# Patient Record
Sex: Male | Born: 1990 | Race: White | Hispanic: No | Marital: Single | Smoking: Former smoker
Health system: Southern US, Community
[De-identification: ages and names within clinical notes are randomized; demographics above are authoritative.]

## PROBLEM LIST (undated history)

## (undated) HISTORY — PX: SHOULDER SURGERY: SHX246

## (undated) HISTORY — PX: WISDOM TOOTH EXTRACTION: SHX21

---
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
## 2016-09-09 DIAGNOSIS — Z0000 Encounter for general adult medical examination without abnormal findings: Secondary | ICD-10-CM | POA: Diagnosis not present

## 2017-03-08 ENCOUNTER — Emergency Department (HOSPITAL_COMMUNITY): Payer: 59

## 2017-03-08 ENCOUNTER — Emergency Department (HOSPITAL_COMMUNITY)
Admission: EM | Admit: 2017-03-08 | Discharge: 2017-03-08 | Disposition: A | Discharge status: Home / Self Care | Payer: 59 | Attending: Emergency Medicine | Admitting: Emergency Medicine

## 2017-03-08 ENCOUNTER — Encounter (HOSPITAL_COMMUNITY): Payer: Self-pay | Admitting: Emergency Medicine

## 2017-03-08 DIAGNOSIS — F17200 Nicotine dependence, unspecified, uncomplicated: Secondary | ICD-10-CM | POA: Diagnosis not present

## 2017-03-08 DIAGNOSIS — R079 Chest pain, unspecified: Secondary | ICD-10-CM | POA: Diagnosis not present

## 2017-03-08 DIAGNOSIS — R002 Palpitations: Secondary | ICD-10-CM | POA: Diagnosis not present

## 2017-03-08 LAB — I-STAT TROPONIN, ED: Troponin i, poc: 0 ng/mL (ref 0.00–0.08)

## 2017-03-08 LAB — BASIC METABOLIC PANEL
ANION GAP: 9 (ref 5–15)
BUN: 10 mg/dL (ref 6–20)
CHLORIDE: 105 mmol/L (ref 101–111)
CO2: 26 mmol/L (ref 22–32)
Calcium: 9.7 mg/dL (ref 8.9–10.3)
Creatinine, Ser: 1.03 mg/dL (ref 0.61–1.24)
GFR calc non Af Amer: >60 mL/min (ref >60)
Glucose, Bld: 105 mg/dL — ABNORMAL HIGH (ref 65–99)
POTASSIUM: 3.8 mmol/L (ref 3.5–5.1)
Sodium: 140 mmol/L (ref 135–145)

## 2017-03-08 LAB — CBC
HEMATOCRIT: 44.9 % (ref 39.0–52.0)
HEMOGLOBIN: 14.9 g/dL (ref 13.0–17.0)
MCH: 28.1 pg (ref 26.0–34.0)
MCHC: 33.2 g/dL (ref 30.0–36.0)
MCV: 84.6 fL (ref 78.0–100.0)
Platelets: 250 10*3/uL (ref 150–400)
RBC: 5.31 MIL/uL (ref 4.22–5.81)
RDW: 13.4 % (ref 11.5–15.5)
WBC: 6.7 10*3/uL (ref 4.0–10.5)

## 2017-03-08 NOTE — Discharge Instructions (Signed)
Follow-up with your primary care provider for reevaluation later this week. They may recommend a Holter monitor to evaluate you for abnormal heart rhythms such as PACs, PVCs, or SVT. If you feel that your palpitations have returned, and your heart rate is elevated (around 120-140 beats per minute), you may blow out continuously through a straw for as long as you can (ideally around 15-20 seconds). Alternatively, you can stick your thumb in your mouth and blow out for the same amount of time. Return to the ED if any concerning signs or symptoms develop such as chest pain, persistent shortness of breath, fevers, chills, or passing out.

## 2017-03-08 NOTE — ED Provider Notes (Signed)
[facility] Provider Note   CSN: [id number] Arrival date & time: 03/08/17  1405     History   Chief Complaint Chief Complaint  Patient presents with  . Palpitations    HPI Tyler Adams is a 26 y.o. male with no significant past medical history who presents today with chief complaint acute onset, resolved episode of palpitations earlier today. Patient states that he was sitting at rest at work around 1 PM when he experienced approximately 5-6 palpitations, followed by development of pallor, lightheadedness, diaphoresis, and chest tightness with associated shortness of breath. This episode did not last longer than a few minutes. He denies chest pain, syncope, nausea, vomiting. He took his blood pressure at the time and noted his systolic to be in the 170s. Symptoms have entirely resolved since then. No aggravating or alleviating factors, and patient did not try anything when this episode occurred. States that he occasionally experiences 1 palpation, but this was the first time it appeared to be sustained for any time. He has panic attacks on occasion, and says that this felt similarly except for the palpitations. States he occasionally smokes cigarettes, but not daily. Has a significant family history of cardiac events, but has no cardiac history himself. States he has reduced his caffeine intake, but did have a diet Coke earlier today. Denies recreational drug use or excessive alcohol intake. No recent travel or surgeries, no hemoptysis, no hormone replacement therapy, no history of cancer, no prior history of DVT or PE.  The history is provided by the patient.    History reviewed. No pertinent past medical history.  There are no active problems to display for this patient.   Past Surgical History:  Procedure Laterality Date  . SHOULDER SURGERY         Home Medications    Prior to Admission medications   Medication Sig Start Date End Date Taking? Authorizing Provider    ibuprofen (ADVIL,MOTRIN) 200 MG tablet Take 400 mg by mouth every 6 (six) hours as needed for moderate pain.   Yes [provider]    Family History No family history on file.  Social History Social History  Substance Use Topics  . Smoking status: Current Some Day Smoker  . Smokeless tobacco: Never Used  . Alcohol use Yes     Allergies   Patient has no known allergies.   Review of Systems Review of Systems  Constitutional: Positive for diaphoresis. Negative for chills and fever.  Respiratory: Positive for chest tightness and shortness of breath.   Cardiovascular: Positive for palpitations. Negative for chest pain and leg swelling.  Gastrointestinal: Negative for abdominal pain, diarrhea, nausea and vomiting.  Neurological: Positive for light-headedness. Negative for syncope, weakness and numbness.  All other systems reviewed and are negative.    Physical Exam Updated Vital Signs BP 138/76   Pulse 87   Temp 98.5 F (36.9 C) (Oral)   Resp 17   Wt 112.5 kg (248 lb)   SpO2 100%   Physical Exam  Constitutional: He appears well-developed and well-nourished. No distress.  HENT:  Head: Normocephalic and atraumatic.  Eyes: Conjunctivae are normal. Right eye exhibits no discharge. Left eye exhibits no discharge.  Neck: No JVD present. No tracheal deviation present.  Cardiovascular: Normal rate, regular rhythm, normal heart sounds and intact distal pulses.  Exam reveals no gallop and no friction rub.   No murmur heard. 2+ radial and DP/PT pulses bl, negative Homan's bl, no erythema or palpable cords, physiological splitting of  S1 and S2 on inspiration.   Pulmonary/Chest: Effort normal and breath sounds normal. No respiratory distress. He has no wheezes. He has no rales. He exhibits no tenderness.  Equal rise and fall of chest, no increased work of breathing  Abdominal: He exhibits no distension.  Musculoskeletal: He exhibits no edema.  Neurological: He is alert.   Fluent speech, no facial droop  Skin: Skin is warm and dry. Capillary refill takes less than 2 seconds. No erythema.  Psychiatric: He has a normal mood and affect. His behavior is normal.  Nursing note and vitals reviewed.    ED Treatments / Results  Labs (all labs ordered are listed, but only abnormal results are displayed) Labs Reviewed  BASIC METABOLIC PANEL - Abnormal; Notable for the following:       Result Value   Glucose, Bld 105 (*)    All other components within normal limits  CBC  I-STAT TROPOININ, ED    EKG  EKG Interpretation  Date/Time:  Monday March 08 2017 15:50:30 EDT Ventricular Rate:  101 PR Interval:  138 QRS Duration: 86 QT Interval:  338 QTC Calculation: 438 R Axis:   92 Text Interpretation:  Sinus tachycardia Rightward axis Borderline ECG rate faster but otherwise no significant change Confirmed by Frederick PeersLittle, Rachel [phone]([id number]) on 03/08/2017 7:40:14 PM       Radiology Dg Chest 2 View  Result Date: 03/08/2017 CLINICAL DATA:  Chest tightness and palpitations since 1:30 p.m. today. Measures noted to be hypertensive. The patient is an occasional smoker. EXAM: CHEST  2 VIEW COMPARISON:  None in PACs FINDINGS: The lungs are adequately inflated and clear. The heart and pulmonary vascularity are normal. The mediastinum is normal in width. The trachea is midline. The bony thorax is unremarkable. IMPRESSION: There is no active cardiopulmonary disease. Electronically Signed   By: David  SwazilandJordan M.D.   On: 03/08/2017 16:48    Procedures Procedures (including critical care time)  Medications Ordered in ED Medications - No data to display   Initial Impression / Assessment and Plan / ED Course  I have reviewed the triage vital signs and the nursing notes.  Pertinent labs & imaging results that were available during my care of the patient were reviewed by me and considered in my medical decision making (see chart for details).     Patient with complaint of episode  of heart palpitations earlier today with associated chest tightness, shortness of breath, pallor, and lightheadedness. Afebrile here, tachycardic and hypertensive initially, with resolution while in the ED. He is well-appearing and in no apparent distress. EKG shows sinus tachycardia but otherwise no significant change from last tracing. Troponin negative, CBC and BMP unremarkable. Chest x-ray shows no acute cardiopulmonary abnormalities. Low suspicion of ACS/MI or PE in the absence of risk factors. Low suspicion of infectious or inflammatory process such as pneumonia, bronchitis, or pericarditis. Suspect possible run of multiples PVCs/PACs or SVT which has resolved. He is safe for discharge home with follow-up with his primary care for further evaluation and possible Holter monitor. Discussed vagal maneuvers in the event he has any recurrence of symptoms with significant tachycardia. Discussed indications for return to the ED immediately. Pt verbalized understanding of and agreement with plan and is safe for discharge home at this time.  Final Clinical Impressions(s) / ED Diagnoses   Final diagnoses:  Palpitations    New Prescriptions New Prescriptions   No medications on file     Jeanie SewerFawze, Kenyette Gundy A, Cordelia Poche-C 03/08/17 2006  Little, Ambrose Finland, MD 03/11/17 [phone]

## 2017-03-08 NOTE — ED Notes (Signed)
Pt departed in NAD, refused use of wheelchair.  

## 2017-03-08 NOTE — ED Triage Notes (Signed)
Tightness and felt sob and got flushed and felt like his heart rate was up,  Did eat and he took his bp and it was up no n/v but feels  weird

## 2017-03-09 DIAGNOSIS — R002 Palpitations: Secondary | ICD-10-CM | POA: Diagnosis not present

## 2017-03-09 DIAGNOSIS — Z6838 Body mass index (BMI) 38.0-38.9, adult: Secondary | ICD-10-CM | POA: Diagnosis not present

## 2017-03-09 DIAGNOSIS — E6609 Other obesity due to excess calories: Secondary | ICD-10-CM | POA: Diagnosis not present

## 2017-03-09 DIAGNOSIS — R030 Elevated blood-pressure reading, without diagnosis of hypertension: Secondary | ICD-10-CM | POA: Diagnosis not present

## 2017-03-09 DIAGNOSIS — Z720 Tobacco use: Secondary | ICD-10-CM | POA: Diagnosis not present

## 2017-03-29 DIAGNOSIS — R002 Palpitations: Secondary | ICD-10-CM | POA: Diagnosis not present

## 2017-04-07 DIAGNOSIS — R002 Palpitations: Secondary | ICD-10-CM | POA: Diagnosis not present

## 2017-04-08 DIAGNOSIS — R002 Palpitations: Secondary | ICD-10-CM | POA: Diagnosis not present

## 2017-06-29 DIAGNOSIS — H52223 Regular astigmatism, bilateral: Secondary | ICD-10-CM | POA: Diagnosis not present

## 2017-08-09 MED FILL — METHYLPREDNISOLONE 4 MG TAB: 4 | 6 days supply | Qty: 21 | Fill #0

## 2017-09-24 ENCOUNTER — Ambulatory Visit
Admission: EM | Admit: 2017-09-24 | Discharge: 2017-09-24 | Disposition: A | Discharge status: Home / Self Care | Payer: 59 | Attending: Family Medicine | Admitting: Family Medicine

## 2017-09-24 ENCOUNTER — Other Ambulatory Visit: Payer: Self-pay

## 2017-09-24 ENCOUNTER — Encounter: Payer: Self-pay | Admitting: Emergency Medicine

## 2017-09-24 DIAGNOSIS — J029 Acute pharyngitis, unspecified: Secondary | ICD-10-CM | POA: Diagnosis not present

## 2017-09-24 DIAGNOSIS — H6122 Impacted cerumen, left ear: Secondary | ICD-10-CM

## 2017-09-24 LAB — RAPID STREP SCREEN (MED CTR MEBANE ONLY): STREPTOCOCCUS, GROUP A SCREEN (DIRECT): NEGATIVE

## 2017-09-24 MED ORDER — NAPROXEN 500 MG PO TABS: 500.0000 mg | ORAL_TABLET | Freq: Two times a day (BID) | ORAL | 0 refills | Status: DC

## 2017-09-24 NOTE — ED Provider Notes (Signed)
[facility]    CSN: [id number] Arrival date & time: 09/24/17  1706     History   Chief Complaint Chief Complaint  Patient presents with  . Sore Throat    APPOINTMENT    HPI Tyler Adams is a 27 y.o. male.   HPI  27 year old male presents with a sore throat that he has had for the past 3 days.  No fevers.  He states that the cough and nasal congestion preceded this since around Christmas time.  Improved somewhat.  Is ago however he began to have left-sided throat discomfort and pain with swallowing.  He looked in his throat today and noticed white debris on his tonsil and decided to come in.       History reviewed. No pertinent past medical history.  There are no active problems to display for this patient.   Past Surgical History:  Procedure Laterality Date  . SHOULDER SURGERY         Home Medications    Prior to Admission medications   Medication Sig Start Date End Date Taking? Authorizing Provider  ibuprofen (ADVIL,MOTRIN) 200 MG tablet Take 400 mg by mouth every 6 (six) hours as needed for moderate pain.    [provider]  naproxen (NAPROSYN) 500 MG tablet Take 1 tablet (500 mg total) by mouth 2 (two) times daily with a meal. 09/24/17   Lutricia Feil, PA-C    Family History History reviewed. No pertinent family history.  Social History Social History   Tobacco Use  . Smoking status: Former Games developer  . Smokeless tobacco: Never Used  Substance Use Topics  . Alcohol use: Yes  . Drug use: No     Allergies   Patient has no known allergies.   Review of Systems Review of Systems  Constitutional: Positive for activity change. Negative for chills, fatigue and fever.  HENT: Positive for congestion and sore throat.   All other systems reviewed and are negative.    Physical Exam Triage Vital Signs ED Triage Vitals  Enc Vitals Group     BP 09/24/17 1727 (!) 141/87     Pulse Rate 09/24/17 1727 74     Resp 09/24/17 1727 16       Temp 09/24/17 1727 98.7 F (37.1 C)     Temp Source 09/24/17 1727 Oral     SpO2 09/24/17 1727 100 %     Weight 09/24/17 1723 240 lb (108.9 kg)     Height 09/24/17 1723 5\' 8"  (1.727 m)     Head Circumference --      Peak Flow --      Pain Score 09/24/17 1724 1     Pain Loc --      Pain Edu? --      Excl. in GC? --    No data found.  Updated Vital Signs BP (!) 141/87 (BP Location: Left Arm)   Pulse 74   Temp 98.7 F (37.1 C) (Oral)   Resp 16   Ht 5\' 8"  (1.727 m)   Wt 240 lb (108.9 kg)   SpO2 100%   BMI 36.49 kg/m   Visual Acuity Right Eye Distance:   Left Eye Distance:   Bilateral Distance:    Right Eye Near:   Left Eye Near:    Bilateral Near:     Physical Exam  Constitutional: He is oriented to person, place, and time. He appears well-developed and well-nourished.  Non-toxic appearance. He does not appear ill.  HENT:  Head: Normocephalic.  Right Ear: Hearing, tympanic membrane and ear canal normal.  Left Ear: Hearing normal.  Mouth/Throat: Oropharynx is clear and moist and mucous membranes are normal. No posterior oropharyngeal edema, posterior oropharyngeal erythema or tonsillar abscesses. Tonsils are 1+ on the right. Tonsils are 0 on the left.  Left TM is obscured by impacted cerumen.  Eyes: Pupils are equal, round, and reactive to light.  Neck: Normal range of motion.  Lymphadenopathy:    He has no cervical adenopathy.  Neurological: He is alert and oriented to person, place, and time.  Skin: Skin is warm and dry.  Psychiatric: He has a normal mood and affect. His behavior is normal.  Nursing note and vitals reviewed.    UC Treatments / Results  Labs (all labs ordered are listed, but only abnormal results are displayed) Labs Reviewed  RAPID STREP SCREEN (NOT AT [facility])  CULTURE, GROUP A STREP [facility])    EKG  EKG Interpretation None       Radiology No results found.  Procedures Unilateral ear lavage.Following the procedure the TM appears  erythematous but normal. Canal was slightly erythematous from the procedure  Medications Ordered in UC Medications - No data to display   Initial Impression / Assessment and Plan / UC Course  I have reviewed the triage vital signs and the nursing notes.  Pertinent labs & imaging results that were available during my care of the patient were reviewed by me and considered in my medical decision making (see chart for details).     Plan: 1. Test/x-ray results and diagnosis reviewed with patient 2. rx as per orders; risks, benefits, potential side effects reviewed with patient 3. Recommend supportive treatment with salt water gargles and Naprosyn for throat irritation and inflammation.  Throat swabs will be available in 48 hours.  Told him this is likely a viral illness does not require antibiotics but will reconsider if the cultures are positive. 4. F/u prn if symptoms worsen or don't improve   Final Clinical Impressions(s) / UC Diagnoses   Final diagnoses:  Sore throat    ED Discharge Orders        Ordered    naproxen (NAPROSYN) 500 MG tablet  2 times daily with meals     09/24/17 1850       Controlled Substance Prescriptions [place] Controlled Substance Registry consulted? Not Applicable   Lutricia FeilRoemer, William P, PA-C 09/24/17 [id number]

## 2017-09-24 NOTE — ED Triage Notes (Signed)
Patient c/o sore throat for the past 3 days.  Patient denies fevers.   

## 2017-09-27 LAB — CULTURE, GROUP A STREP (THRC)

## 2017-10-01 ENCOUNTER — Ambulatory Visit
Admission: EM | Admit: 2017-10-01 | Discharge: 2017-10-01 | Disposition: A | Discharge status: Home / Self Care | Payer: 59 | Attending: Family Medicine | Admitting: Family Medicine

## 2017-10-01 ENCOUNTER — Other Ambulatory Visit: Payer: Self-pay

## 2017-10-01 ENCOUNTER — Encounter: Payer: Self-pay | Admitting: Emergency Medicine

## 2017-10-01 DIAGNOSIS — K122 Cellulitis and abscess of mouth: Secondary | ICD-10-CM

## 2017-10-01 MED ORDER — AMOXICILLIN 875 MG PO TABS: 875.0000 mg | ORAL_TABLET | Freq: Two times a day (BID) | ORAL | 0 refills | Status: DC

## 2017-10-01 NOTE — Discharge Instructions (Signed)
Salt water rinses Tylenol/advil as needed for pain

## 2017-10-01 NOTE — ED Triage Notes (Signed)
Patient c/o throbbing left sided jaw pain that started this morning.  Patient denies fevers.

## 2017-10-01 NOTE — ED Provider Notes (Signed)
[facility]    CSN: [id number] Arrival date & time: 10/01/17  1528     History   Chief Complaint Chief Complaint  Patient presents with  . Jaw Pain    APPOINTMENT    HPI Tyler Adams is a 27 y.o. male.   27 yo male with a c/o throbbing pain to the left lower area of the mouth behind the molars since this morning.  Denies any trauma/injury, fevers, chills. States he noticed some swelling and slight drainage from this area.    The history is provided by the patient.    History reviewed. No pertinent past medical history.  There are no active problems to display for this patient.   Past Surgical History:  Procedure Laterality Date  . SHOULDER SURGERY    . WISDOM TOOTH EXTRACTION         Home Medications    Prior to Admission medications   Medication Sig Start Date End Date Taking? Authorizing Provider  ibuprofen (ADVIL,MOTRIN) 200 MG tablet Take 400 mg by mouth every 6 (six) hours as needed for moderate pain.   Yes [provider]  naproxen (NAPROSYN) 500 MG tablet Take 1 tablet (500 mg total) by mouth 2 (two) times daily with a meal. 09/24/17  Yes Lutricia Feiloemer, William P, PA-C  amoxicillin (AMOXIL) 875 MG tablet Take 1 tablet (875 mg total) by mouth 2 (two) times daily. 10/01/17   Payton Mccallumonty, Tadan Shill, MD    Family History History reviewed. No pertinent family history.  Social History Social History   Tobacco Use  . Smoking status: Former Games developermoker  . Smokeless tobacco: Never Used  Substance Use Topics  . Alcohol use: Yes  . Drug use: No     Allergies   Patient has no known allergies.   Review of Systems Review of Systems   Physical Exam Triage Vital Signs ED Triage Vitals  Enc Vitals Group     BP 10/01/17 1553 134/75     Pulse Rate 10/01/17 1553 67     Resp 10/01/17 1553 16     Temp 10/01/17 1553 98.8 F (37.1 C)     Temp Source 10/01/17 1553 Oral     SpO2 10/01/17 1553 100 %     Weight 10/01/17 1551 240 lb (108.9 kg)     Height  10/01/17 1551 5\' 8"  (1.727 m)     Head Circumference --      Peak Flow --      Pain Score 10/01/17 1551 7     Pain Loc --      Pain Edu? --      Excl. in GC? --    No data found.  Updated Vital Signs BP 134/75 (BP Location: Right Arm)   Pulse 67   Temp 98.8 F (37.1 C) (Oral)   Resp 16   Ht 5\' 8"  (1.727 m)   Wt 240 lb (108.9 kg)   SpO2 100%   BMI 36.49 kg/m   Visual Acuity Right Eye Distance:   Left Eye Distance:   Bilateral Distance:    Right Eye Near:   Left Eye Near:    Bilateral Near:     Physical Exam  Constitutional: He appears well-developed and well-nourished. No distress.  HENT:  Mouth/Throat: Oral lesions (left lower posterior soft tissue area (behind the lower molars) with eryrthema, edema and tenderness) present. No oropharyngeal exudate, posterior oropharyngeal edema, posterior oropharyngeal erythema or tonsillar abscesses. No tonsillar exudate.    Skin: He is not diaphoretic.  Nursing note and vitals reviewed.    UC Treatments / Results  Labs (all labs ordered are listed, but only abnormal results are displayed) Labs Reviewed - No data to display  EKG  EKG Interpretation None       Radiology No results found.  Procedures Procedures (including critical care time)  Medications Ordered in UC Medications - No data to display   Initial Impression / Assessment and Plan / UC Course  I have reviewed the triage vital signs and the nursing notes.  Pertinent labs & imaging results that were available during my care of the patient were reviewed by me and considered in my medical decision making (see chart for details).       Final Clinical Impressions(s) / UC Diagnoses   Final diagnoses:  Oral infection    ED Discharge Orders        Ordered    amoxicillin (AMOXIL) 875 MG tablet  2 times daily     10/01/17 1628     1. diagnosis reviewed with patient 2. rx as per orders above; reviewed possible side effects, interactions, risks and  benefits  3. Recommend supportive treatment with salt water rinses; otc analgesics prn  4. Follow-up prn if symptoms worsen or don't improve  Controlled Substance Prescriptions [place] Controlled Substance Registry consulted? Not Applicable   Payton Mccallum, MD 10/01/17 2041

## 2017-10-04 ENCOUNTER — Telehealth: Payer: Self-pay | Admitting: Emergency Medicine

## 2017-10-04 NOTE — Telephone Encounter (Signed)
Called to follow up after patient's recent visit. Left message to call with any questions or concerns. 

## 2017-10-15 DIAGNOSIS — S61452A Open bite of left hand, initial encounter: Secondary | ICD-10-CM | POA: Diagnosis not present

## 2017-10-15 DIAGNOSIS — Z23 Encounter for immunization: Secondary | ICD-10-CM | POA: Diagnosis not present

## 2017-10-15 DIAGNOSIS — S61432A Puncture wound without foreign body of left hand, initial encounter: Secondary | ICD-10-CM | POA: Diagnosis not present

## 2017-10-15 DIAGNOSIS — S60512A Abrasion of left hand, initial encounter: Secondary | ICD-10-CM | POA: Diagnosis not present

## 2018-07-28 DIAGNOSIS — R030 Elevated blood-pressure reading, without diagnosis of hypertension: Secondary | ICD-10-CM | POA: Diagnosis not present

## 2018-07-28 DIAGNOSIS — K210 Gastro-esophageal reflux disease with esophagitis: Secondary | ICD-10-CM | POA: Diagnosis not present

## 2018-07-28 DIAGNOSIS — Z0000 Encounter for general adult medical examination without abnormal findings: Secondary | ICD-10-CM | POA: Diagnosis not present

## 2018-07-28 DIAGNOSIS — R002 Palpitations: Secondary | ICD-10-CM | POA: Diagnosis not present

## 2018-08-29 DIAGNOSIS — R002 Palpitations: Secondary | ICD-10-CM | POA: Diagnosis not present

## 2018-08-29 DIAGNOSIS — E6609 Other obesity due to excess calories: Secondary | ICD-10-CM | POA: Diagnosis not present

## 2018-08-29 DIAGNOSIS — Z8249 Family history of ischemic heart disease and other diseases of the circulatory system: Secondary | ICD-10-CM | POA: Diagnosis not present

## 2018-08-29 DIAGNOSIS — Z6838 Body mass index (BMI) 38.0-38.9, adult: Secondary | ICD-10-CM | POA: Diagnosis not present

## 2018-10-26 ENCOUNTER — Other Ambulatory Visit: Payer: Self-pay

## 2018-10-26 ENCOUNTER — Encounter (HOSPITAL_COMMUNITY): Payer: Self-pay | Admitting: Emergency Medicine

## 2018-10-26 ENCOUNTER — Ambulatory Visit (HOSPITAL_COMMUNITY)
Admission: EM | Admit: 2018-10-26 | Discharge: 2018-10-26 | Disposition: A | Discharge status: Home / Self Care | Payer: 59 | Attending: Internal Medicine | Admitting: Internal Medicine

## 2018-10-26 DIAGNOSIS — M62830 Muscle spasm of back: Secondary | ICD-10-CM | POA: Diagnosis not present

## 2018-10-26 DIAGNOSIS — T148XXA Other injury of unspecified body region, initial encounter: Secondary | ICD-10-CM

## 2018-10-26 MED ORDER — IBUPROFEN 600 MG PO TABS: 600.0000 mg | ORAL_TABLET | Freq: Four times a day (QID) | ORAL | 0 refills | Status: DC | PRN

## 2018-10-26 NOTE — ED Triage Notes (Signed)
Placed belongings on desk at work and had pain in back immediately pain in right side of back below ribs, movement creates shooting pain and throbbing.

## 2018-10-26 NOTE — ED Provider Notes (Signed)
[facility]    CSN: [id number] Arrival date & time: 10/26/18  0841     History   Chief Complaint Chief Complaint  Patient presents with  . Back Pain    HPI Tyler Adams is a 28 y.o. male with no past medical history comes to the urgent care department on account of acute onset back pain that happened this morning.  Patient was bending over to pick up his lunch box when symptoms started.  Pain is sharp, nonradiating and severe.  Is worsened by movement and relieved by rest.  No numbness or tingling.  No dizziness, near syncope or syncopal episode.  Patient denies any trauma to the back or heavy lifting.  HPI  No past medical history on file.  There are no active problems to display for this patient.   Past Surgical History:  Procedure Laterality Date  . SHOULDER SURGERY    . WISDOM TOOTH EXTRACTION         Home Medications    Prior to Admission medications   Medication Sig Start Date End Date Taking? Authorizing Provider  amoxicillin (AMOXIL) 875 MG tablet Take 1 tablet (875 mg total) by mouth 2 (two) times daily. 10/01/17   Payton Mccallum, MD  ibuprofen (ADVIL,MOTRIN) 200 MG tablet Take 400 mg by mouth every 6 (six) hours as needed for moderate pain.    [provider]  naproxen (NAPROSYN) 500 MG tablet Take 1 tablet (500 mg total) by mouth 2 (two) times daily with a meal. 09/24/17   Lutricia Feil, PA-C    Family History No family history on file.  Social History Social History   Tobacco Use  . Smoking status: Former Games developer  . Smokeless tobacco: Never Used  Substance Use Topics  . Alcohol use: Yes  . Drug use: No     Allergies   Patient has no known allergies.   Review of Systems Review of Systems  Constitutional: Positive for activity change. Negative for appetite change, chills, fatigue and fever.  Eyes: Negative for pain, discharge and itching.  Respiratory: Negative for cough, chest tightness and wheezing.     Cardiovascular: Negative for chest pain and palpitations.  Gastrointestinal: Negative for abdominal distention, abdominal pain, constipation, diarrhea and nausea.  Musculoskeletal: Positive for back pain. Negative for arthralgias, gait problem and joint swelling.  Allergic/Immunologic: Negative for environmental allergies and food allergies.  Neurological: Negative for dizziness, weakness, numbness and headaches.  Hematological: Negative for adenopathy.     Physical Exam Triage Vital Signs ED Triage Vitals [10/26/18 0940]  Enc Vitals Group     BP 140/82     Pulse Rate 72     Resp 18     Temp 98.2 F (36.8 C)     Temp Source Oral     SpO2 100 %     Weight      Height      Head Circumference      Peak Flow      Pain Score      Pain Loc      Pain Edu?      Excl. in GC?    No data found.  Updated Vital Signs BP 140/82 (BP Location: Left Arm)   Pulse 72   Temp 98.2 F (36.8 C) (Oral)   Resp 18   SpO2 100%   Visual Acuity Right Eye Distance:   Left Eye Distance:   Bilateral Distance:    Right Eye Near:   Left Eye Near:  Bilateral Near:     Physical Exam Constitutional:      General: He is not in acute distress.    Appearance: Normal appearance. He is not ill-appearing.  Neck:     Musculoskeletal: Normal range of motion. No neck rigidity or muscular tenderness.  Cardiovascular:     Rate and Rhythm: Normal rate and regular rhythm.     Heart sounds: No murmur. No friction rub. No gallop.   Pulmonary:     Effort: Pulmonary effort is normal.     Breath sounds: Normal breath sounds. No wheezing or rales.  Abdominal:     General: Abdomen is flat. Bowel sounds are normal.     Palpations: Abdomen is soft.  Musculoskeletal:        General: Tenderness present. No swelling or deformity.     Comments: Range of motion of the back is with tenderness pain.  He has full range of motion.  Skin:    General: Skin is warm and dry.     Capillary Refill: Capillary refill  takes less than 2 seconds.  Neurological:     General: No focal deficit present.     Mental Status: He is alert and oriented to person, place, and time.     Cranial Nerves: No cranial nerve deficit.     Motor: No weakness.     Coordination: Coordination normal.     Deep Tendon Reflexes: Reflexes normal.      UC Treatments / Results  Labs (all labs ordered are listed, but only abnormal results are displayed) Labs Reviewed - No data to display  EKG None  Radiology No results found.  Procedures Procedures (including critical care time)  Medications Ordered in UC Medications - No data to display  Initial Impression / Assessment and Plan / UC Course  I have reviewed the triage vital signs and the nursing notes.  Pertinent labs & imaging results that were available during my care of the patient were reviewed by me and considered in my medical decision making (see chart for details).     1.  Acute back pain likely musculoskeletal skeletal sprain: Warm compress Anti-inflammatory agents-Ibuprofen prescribed Gentle range of motion exercises  Final Clinical Impressions(s) / UC Diagnoses   Final diagnoses:  None   Discharge Instructions   None    ED Prescriptions    None     Controlled Substance Prescriptions [place] Controlled Substance Registry consulted? No   Merrilee Jansky, MD 10/26/18 1042

## 2019-06-24 IMAGING — CR DG CHEST 2V
2 series · 2 of 2 positions shown · non-contrast
Comparison: None in PACs

CLINICAL DATA: Chest tightness and palpitations since [DATE] p.m.
today. Measures noted to be hypertensive. The patient is an
occasional smoker.

EXAM:
CHEST  2 VIEW

[chest pa]
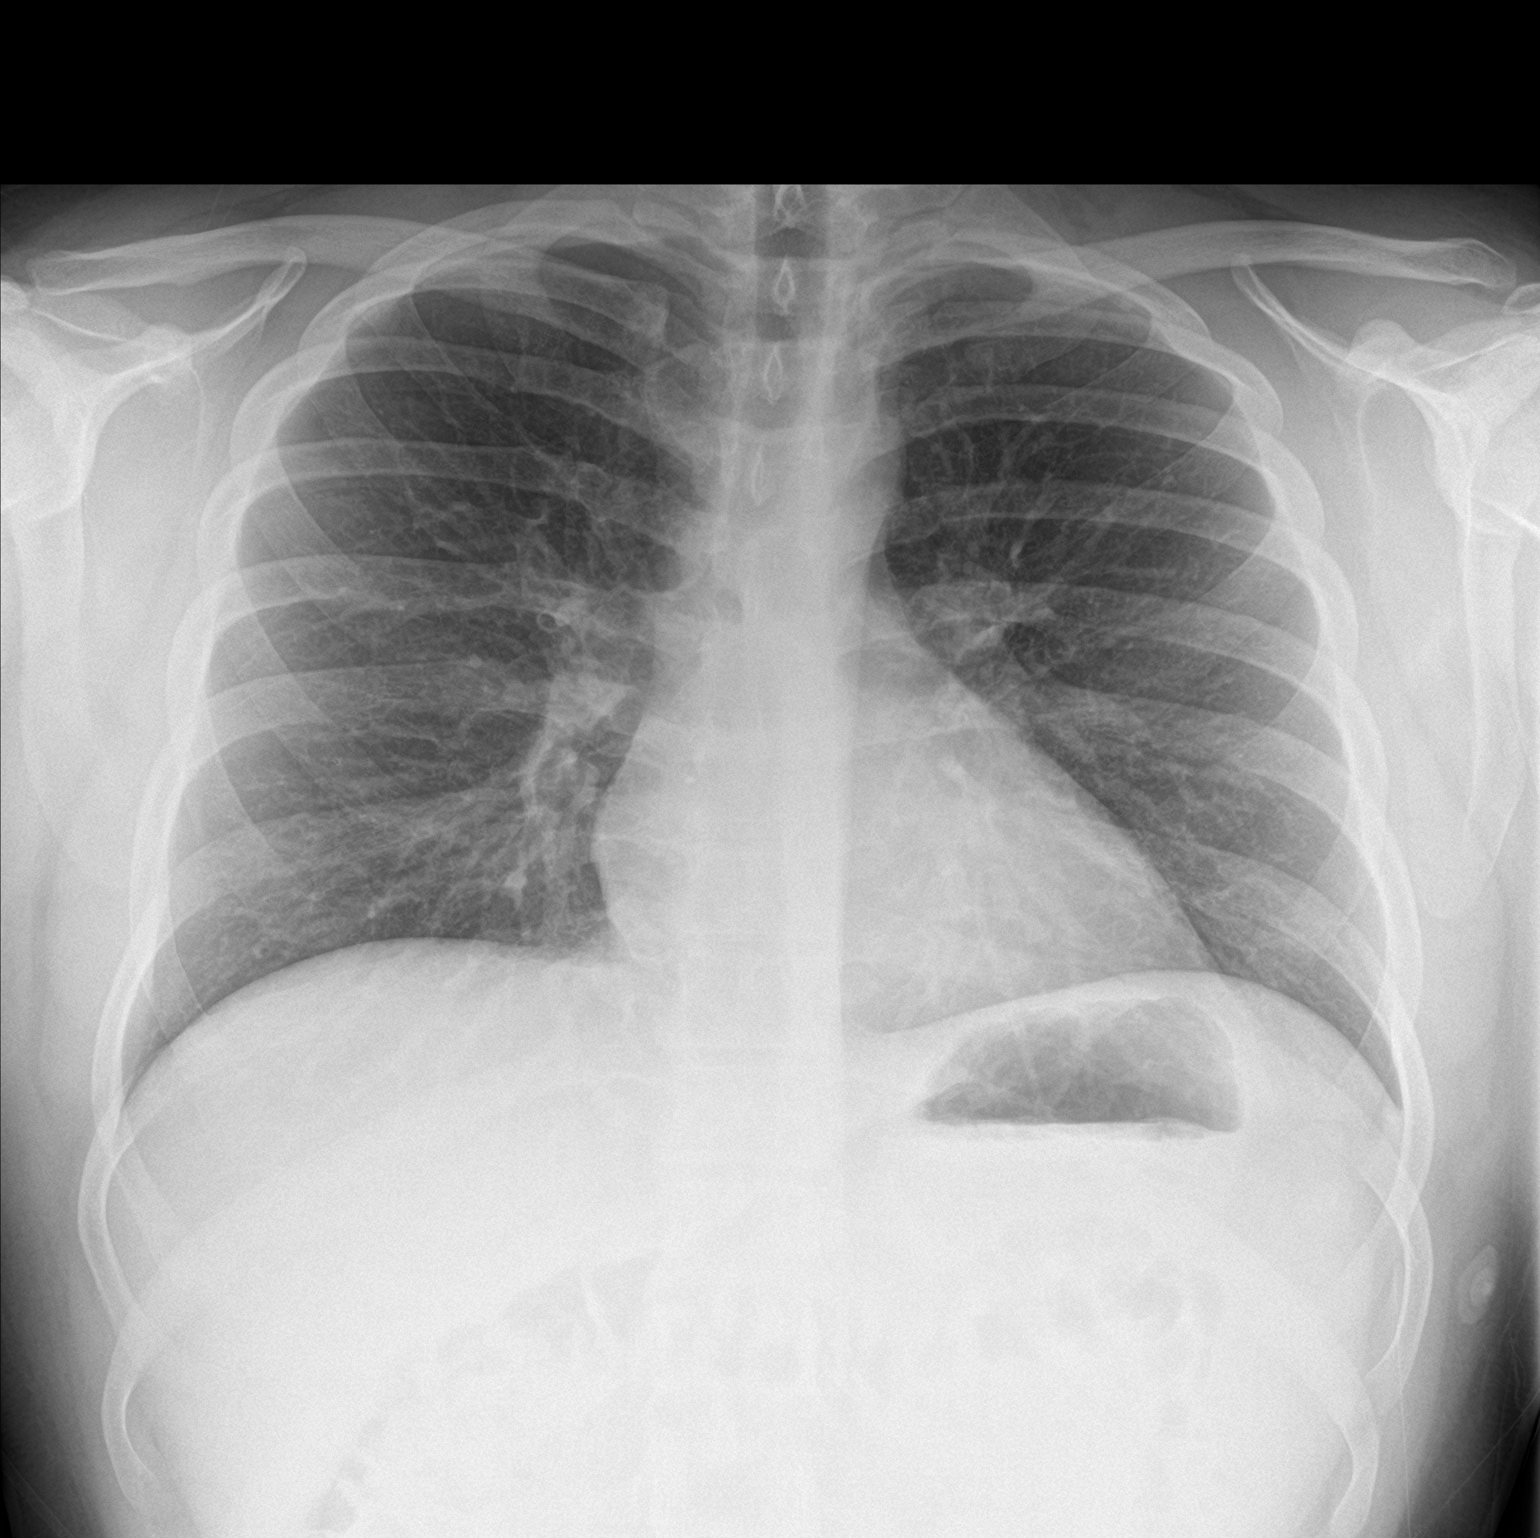

[chest lat]
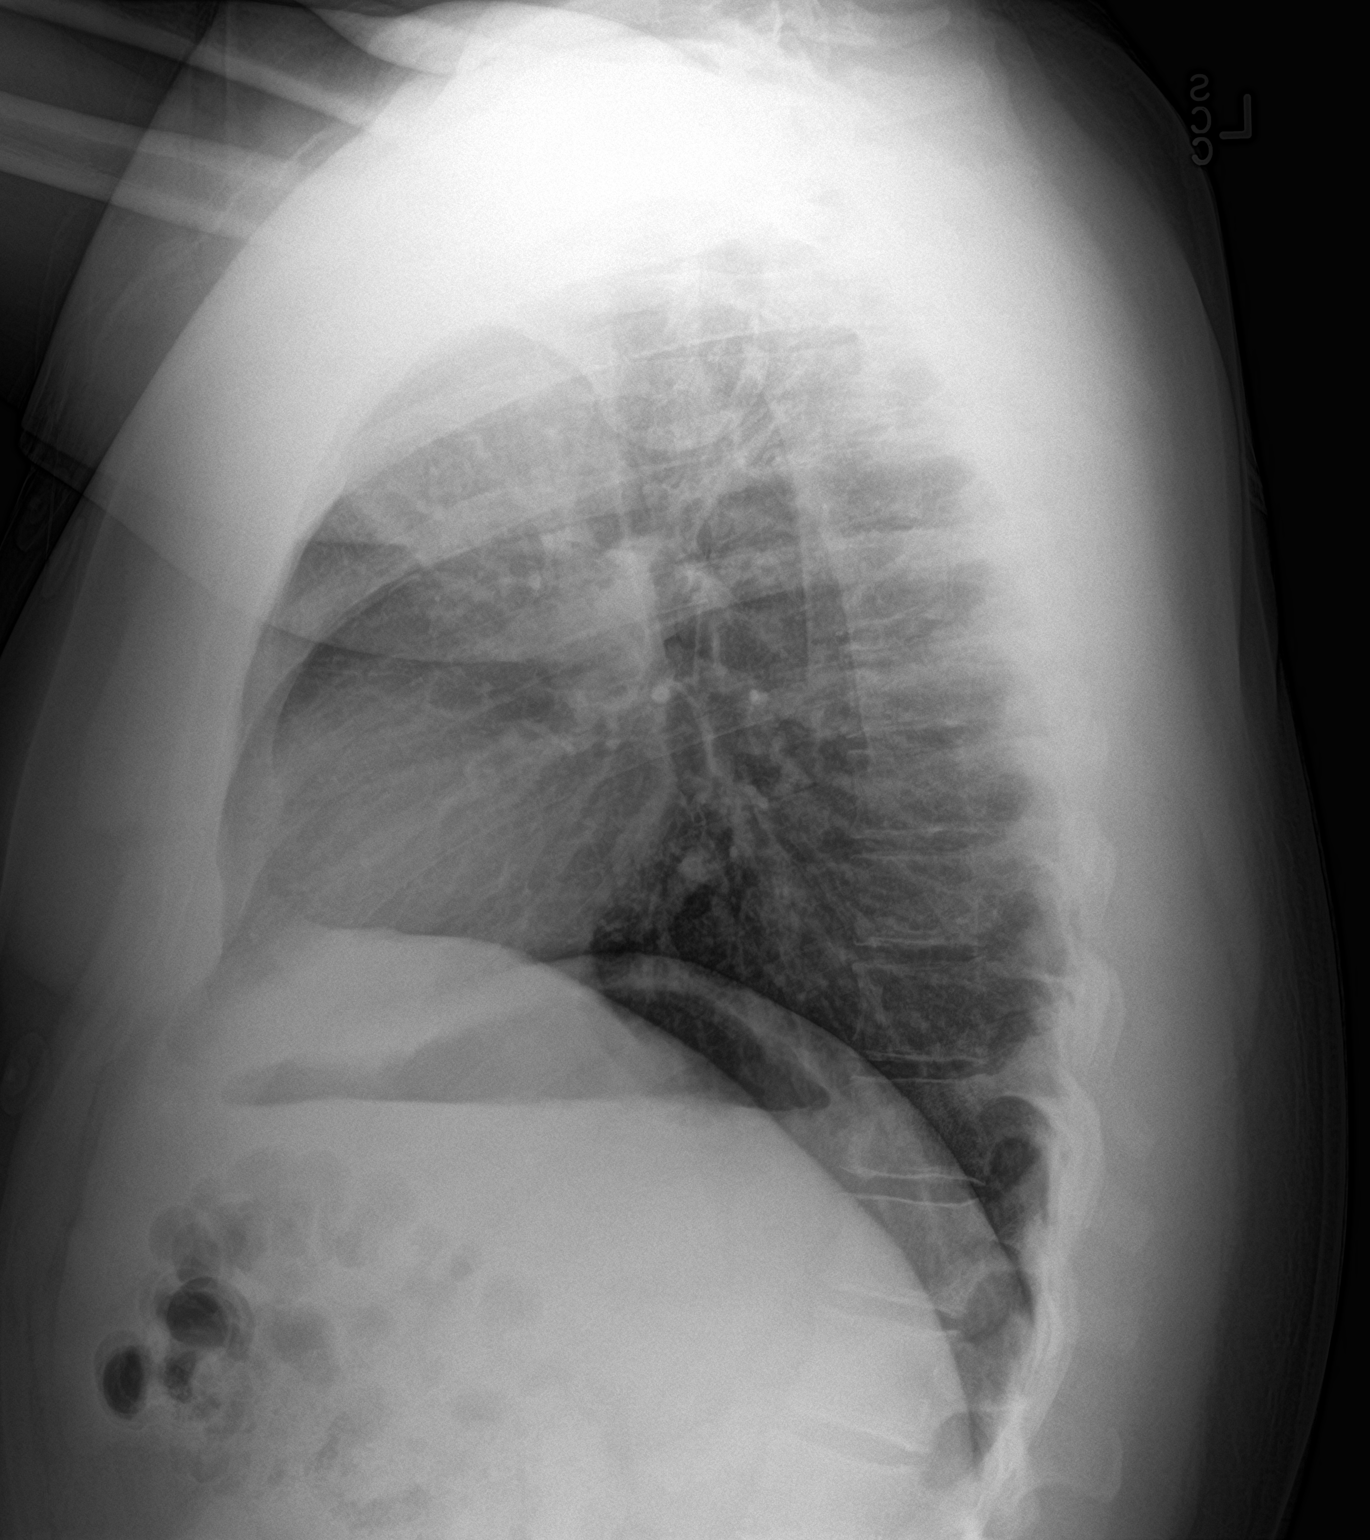

[2 of 2 positions shown; findings below may reference images not displayed]

FINDINGS: The lungs are adequately inflated and clear. The heart and pulmonary
vascularity are normal. The mediastinum is normal in width. The
trachea is midline. The bony thorax is unremarkable.
IMPRESSION: There is no active cardiopulmonary disease.

## 2019-07-15 ENCOUNTER — Encounter: Payer: Self-pay | Admitting: Emergency Medicine

## 2019-07-15 ENCOUNTER — Other Ambulatory Visit: Payer: Self-pay

## 2019-07-15 ENCOUNTER — Ambulatory Visit
Admission: EM | Admit: 2019-07-15 | Discharge: 2019-07-15 | Disposition: A | Discharge status: Home / Self Care | Payer: BC Managed Care – PPO | Attending: Family Medicine | Admitting: Family Medicine

## 2019-07-15 DIAGNOSIS — T8130XA Disruption of wound, unspecified, initial encounter: Secondary | ICD-10-CM | POA: Diagnosis not present

## 2019-07-15 MED ORDER — MUPIROCIN 2 % EX OINT: 1.0000 "application " | TOPICAL_OINTMENT | Freq: Two times a day (BID) | CUTANEOUS | 0 refills | Status: AC

## 2019-07-15 NOTE — Discharge Instructions (Signed)
Keep wound dressed.  Antibiotic ointment as prescribed.  Take care  Dr. Lacinda Axon

## 2019-07-15 NOTE — ED Provider Notes (Signed)
[facility]    CSN: [id number] Arrival date & time: 07/15/19  0810  History   Chief Complaint Chief Complaint  Patient presents with  . Wound Check   HPI  28 year old male presents with complaints of wound dehiscence.  Patient had an excisional biopsy of a skin lesion on his left posterior shoulder on 10/20.  Sutures were removed yesterday.  Patient states that he took off his Band-Aid and the wound reopened.  Denies pain.  Denies purulent drainage.  No fever.  Mild surrounding erythema at the borders/edges.  He believes that it reopened after removing his arm.  He has dressed the wound.  No other interventions tried.  No other complaints.  PMH, Surgical Hx, Family Hx, Social History reviewed and updated as below.  PMH: Obesity  Past Surgical History:  Procedure Laterality Date  . SHOULDER SURGERY    . WISDOM TOOTH EXTRACTION     Home Medications    Prior to Admission medications   Medication Sig Start Date End Date Taking? Authorizing Provider  ibuprofen (ADVIL,MOTRIN) 600 MG tablet Take 1 tablet (600 mg total) by mouth every 6 (six) hours as needed. 10/26/18   Lamptey, Britta Mccreedy, MD  mupirocin ointment (BACTROBAN) 2 % Apply 1 application topically 2 (two) times daily for 7 days. 07/15/19 07/22/19  Tommie Sams, DO    Family History Family History  Problem Relation Age of Onset  . Heart disease Father     Social History Social History   Tobacco Use  . Smoking status: Former Games developer  . Smokeless tobacco: Never Used  Substance Use Topics  . Alcohol use: Yes  . Drug use: No     Allergies   Patient has no known allergies.   Review of Systems Review of Systems  Constitutional: Negative.   Skin: Positive for wound.   Physical Exam Triage Vital Signs ED Triage Vitals  Enc Vitals Group     BP 07/15/19 0826 140/85     Pulse Rate 07/15/19 0826 75     Resp 07/15/19 0826 16     Temp 07/15/19 0826 98.4 F (36.9 C)     Temp Source 07/15/19 0826 Oral      SpO2 07/15/19 0826 100 %     Weight 07/15/19 0824 255 lb (115.7 kg)     Height 07/15/19 0824 5\' 8"  (1.727 m)     Head Circumference --      Peak Flow --      Pain Score 07/15/19 0824 0     Pain Loc --      Pain Edu? --      Excl. in GC? --    Updated Vital Signs BP 140/85 (BP Location: Left Arm)   Pulse 75   Temp 98.4 F (36.9 C) (Oral)   Resp 16   Ht 5\' 8"  (1.727 m)   Wt 115.7 kg   SpO2 100%   BMI 38.77 kg/m   Visual Acuity Right Eye Distance:   Left Eye Distance:   Bilateral Distance:    Right Eye Near:   Left Eye Near:    Bilateral Near:     Physical Exam Vitals signs and nursing note reviewed.  Constitutional:      General: He is not in acute distress.    Appearance: Normal appearance. He is obese. He is not ill-appearing.  HENT:     Head: Normocephalic and atraumatic.  Eyes:     General:        Right  eye: No discharge.        Left eye: No discharge.     Conjunctiva/sclera: Conjunctivae normal.  Pulmonary:     Effort: Pulmonary effort is normal. No respiratory distress.  Skin:         Comments: Small circular open wound.  Edges with mild erythema.  Minimal drainage.  Nontender.  Neurological:     Mental Status: He is alert.  Psychiatric:        Mood and Affect: Mood normal.        Behavior: Behavior normal.     UC Treatments / Results  Labs (all labs ordered are listed, but only abnormal results are displayed) Labs Reviewed - No data to display  EKG   Radiology No results found.  Procedures Procedures (including critical care time)  Medications Ordered in UC Medications - No data to display  Initial Impression / Assessment and Plan / UC Course  I have reviewed the triage vital signs and the nursing notes.  Pertinent labs & imaging results that were available during my care of the patient were reviewed by me and considered in my medical decision making (see chart for details).    28 year old male presents with wound dehiscence.   Steri-Strips used to close or approximate his wound. Rx sent for Bactroban ointment. Supportive care.  Final Clinical Impressions(s) / UC Diagnoses   Final diagnoses:  Wound dehiscence     Discharge Instructions     Keep wound dressed.  Antibiotic ointment as prescribed.  Take care  Dr. Lacinda Axon    ED Prescriptions    Medication Sig Dispense Auth. Provider   mupirocin ointment (BACTROBAN) 2 % Apply 1 application topically 2 (two) times daily for 7 days. 22 g Coral Spikes, DO     PDMP not reviewed this encounter.   Coral Spikes, DO 07/15/19 0900

## 2019-07-15 NOTE — ED Triage Notes (Signed)
Patient had skin biopsy done about 10 days.  Patient states that when he removed his bandaid yesterday he noticed that the wound had open back. Patient denies pain.

## 2021-02-05 ENCOUNTER — Ambulatory Visit
Admission: RE | Admit: 2021-02-05 | Discharge: 2021-02-05 | Disposition: A | Discharge status: Home / Self Care | Payer: BC Managed Care – PPO | Source: Ambulatory Visit | Attending: Sports Medicine | Admitting: Sports Medicine

## 2021-02-05 ENCOUNTER — Other Ambulatory Visit: Payer: Self-pay

## 2021-02-05 VITALS — BP 141/99 | HR 81 | Temp 98.9°F | Resp 18 | Ht 68.0 in | Wt 260.0 lb

## 2021-02-05 DIAGNOSIS — R0982 Postnasal drip: Secondary | ICD-10-CM | POA: Diagnosis not present

## 2021-02-05 DIAGNOSIS — J069 Acute upper respiratory infection, unspecified: Secondary | ICD-10-CM | POA: Insufficient documentation

## 2021-02-05 DIAGNOSIS — J029 Acute pharyngitis, unspecified: Secondary | ICD-10-CM | POA: Diagnosis not present

## 2021-02-05 DIAGNOSIS — Z20822 Contact with and (suspected) exposure to covid-19: Secondary | ICD-10-CM | POA: Diagnosis not present

## 2021-02-05 DIAGNOSIS — M791 Myalgia, unspecified site: Secondary | ICD-10-CM | POA: Insufficient documentation

## 2021-02-05 DIAGNOSIS — Z87891 Personal history of nicotine dependence: Secondary | ICD-10-CM | POA: Diagnosis not present

## 2021-02-05 DIAGNOSIS — R0981 Nasal congestion: Secondary | ICD-10-CM | POA: Diagnosis not present

## 2021-02-05 LAB — RESP PANEL BY RT-PCR (FLU A&B, COVID) ARPGX2
Influenza A by PCR: NEGATIVE
Influenza B by PCR: NEGATIVE
SARS Coronavirus 2 by RT PCR: NEGATIVE

## 2021-02-05 LAB — GROUP A STREP BY PCR: Group A Strep by PCR: NOT DETECTED

## 2021-02-05 MED ORDER — FLUTICASONE PROPIONATE 50 MCG/ACT NA SUSP: 2.0000 | Freq: Every day | NASAL | 0 refills | Status: AC

## 2021-02-05 NOTE — ED Triage Notes (Signed)
Patient states that he has nasal congestion, fatigue and sore throat with body aches x Friday with worsening symptoms on Monday.

## 2021-02-05 NOTE — Discharge Instructions (Addendum)
Strep test was negative. Influenza and COVID test were both negative. Please see educational handouts. If symptoms persist please see your primary care provider. If symptoms worsen please go to the ER. I sent in a prescription to your pharmacy.  Nasal congestion and postnasal drip.  Hopefully that will help with your sore throat. Plenty of rest, plenty of fluids, Tylenol or Motrin for any fever or discomfort.

## 2021-02-05 NOTE — ED Provider Notes (Signed)
[facility]    CSN: [id number] Arrival date & time: 02/05/21  1349      History   Chief Complaint Chief Complaint  Patient presents with  . Nasal Congestion    HPI Tyler Adams is a 30 y.o. male.   Old male who presents for evaluation of the above issue.  Normally goes to [facility] for his ongoing primary care needs.  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  He reports 5 days of URI symptoms including nasal congestion, fatigue, and myalgias.  He also has a little postnasal drip.  His sore throat began yesterday.  He denies any cough, no fever shakes chills, no nausea vomiting diarrhea.  No urinary or abdominal issues.  He denies chest pain or shortness of breath.  No history of asthma.  He is generally healthy has no medical issues.  Does not take medicines on a regular basis.  He did take a home COVID test which was negative.  He has been vaccinated x2 but no booster.  He is also received his flu shot.  No COVID history or COVID exposure.  No red flag signs or symptoms elicited on history.     History reviewed. No pertinent past medical history.  There are no problems to display for this patient.   Past Surgical History:  Procedure Laterality Date  . SHOULDER SURGERY    . WISDOM TOOTH EXTRACTION         Home Medications    Prior to Admission medications   Medication Sig Start Date End Date Taking? Authorizing Provider  fluticasone (FLONASE) 50 MCG/ACT nasal spray Place 2 sprays into both nostrils daily. 02/05/21  Yes Delton See, MD  ibuprofen (ADVIL,MOTRIN) 600 MG tablet Take 1 tablet (600 mg total) by mouth every 6 (six) hours as needed. 10/26/18   LampteyBritta Mccreedy, MD    Family History Family History  Problem Relation Age of Onset  . Heart disease Father     Social History Social History   Tobacco Use  . Smoking status: Former Games developer  . Smokeless tobacco: Never Used  Vaping Use  . Vaping Use: Never used  Substance Use Topics  . Alcohol  use: Yes  . Drug use: No     Allergies   Patient has no known allergies.   Review of Systems Review of Systems  Constitutional: Positive for fatigue. Negative for appetite change, chills, diaphoresis and fever.  HENT: Positive for congestion and postnasal drip. Negative for ear pain, rhinorrhea, sinus pressure, sinus pain, sneezing and sore throat.   Eyes: Negative for pain.  Respiratory: Negative for cough, chest tightness and shortness of breath.   Cardiovascular: Negative for chest pain and palpitations.  Gastrointestinal: Negative for abdominal pain, diarrhea, nausea and vomiting.  Genitourinary: Negative for dysuria.  Musculoskeletal: Positive for myalgias. Negative for arthralgias, back pain and neck pain.  Skin: Negative for color change, pallor, rash and wound.  Neurological: Negative for dizziness, syncope, light-headedness and headaches.  All other systems reviewed and are negative.    Physical Exam Triage Vital Signs ED Triage Vitals  Enc Vitals Group     BP 02/05/21 1408 (!) 141/99     Pulse Rate 02/05/21 1408 81     Resp 02/05/21 1408 18     Temp 02/05/21 1408 98.9 F (37.2 C)     Temp Source 02/05/21 1408 Oral     SpO2 02/05/21 1408 98 %     Weight 02/05/21 1407 260 lb (117.9  kg)     Height 02/05/21 1407 5\' 8"  (1.727 m)     Head Circumference --      Peak Flow --      Pain Score 02/05/21 1405 2     Pain Loc --      Pain Edu? --      Excl. in GC? --    No data found.  Updated Vital Signs BP (!) 141/99 (BP Location: Right Arm)   Pulse 81   Temp 98.9 F (37.2 C) (Oral)   Resp 18   Ht 5\' 8"  (1.727 m)   Wt 117.9 kg   SpO2 98%   BMI 39.53 kg/m   Visual Acuity Right Eye Distance:   Left Eye Distance:   Bilateral Distance:    Right Eye Near:   Left Eye Near:    Bilateral Near:     Physical Exam Vitals and nursing note reviewed.  Constitutional:      General: He is not in acute distress.    Appearance: Normal appearance. He is not  ill-appearing, toxic-appearing or diaphoretic.  HENT:     Head: Normocephalic and atraumatic.     Nose: Congestion present. No rhinorrhea.     Mouth/Throat:     Mouth: Mucous membranes are moist.     Pharynx: Posterior oropharyngeal erythema present. No oropharyngeal exudate.  Eyes:     General: No scleral icterus.       Right eye: No discharge.        Left eye: No discharge.     Extraocular Movements: Extraocular movements intact.     Conjunctiva/sclera: Conjunctivae normal.     Pupils: Pupils are equal, round, and reactive to light.  Cardiovascular:     Rate and Rhythm: Normal rate and regular rhythm.     Pulses: Normal pulses.     Heart sounds: Normal heart sounds. No murmur heard. No friction rub. No gallop.   Pulmonary:     Effort: Pulmonary effort is normal.     Breath sounds: Normal breath sounds. No stridor. No wheezing, rhonchi or rales.  Musculoskeletal:     Cervical back: Normal range of motion and neck supple. No rigidity or tenderness.  Lymphadenopathy:     Cervical: No cervical adenopathy.  Skin:    General: Skin is warm and dry.     Capillary Refill: Capillary refill takes less than 2 seconds.     Findings: No bruising, erythema, lesion or rash.  Neurological:     General: No focal deficit present.     Mental Status: He is alert and oriented to person, place, and time.  Psychiatric:        Mood and Affect: Mood normal.      UC Treatments / Results  Labs (all labs ordered are listed, but only abnormal results are displayed) Labs Reviewed  RESP PANEL BY RT-PCR (FLU A&B, COVID) ARPGX2  GROUP A STREP BY PCR    EKG   Radiology No results found.  Procedures Procedures (including critical care time)  Medications Ordered in UC Medications - No data to display  Initial Impression / Assessment and Plan / UC Course  I have reviewed the triage vital signs and the nursing notes.  Pertinent labs & imaging results that were available during my care of the  patient were reviewed by me and considered in my medical decision making (see chart for details).  Clinical impression: URI symptoms now for 5 days.  Sore throat began yesterday.  Treatment plan: 1.  The findings and treatment plan were discussed in detail with the patient.  Patient was in agreement. 2.  We will obtain a strep test.  It was negative. 3.  We will obtain a respiratory panel.  It was negative for COVID and influenza. 4.  Educational handouts provided. 5.  If symptoms persist he should see his primary care provider. 6.  If symptoms worsen then he should go to the ER. 7.  Plenty of rest, plenty fluids, Tylenol or Motrin for any fever or discomfort. 8.  Sent in a prescription for Flonase to help with his nasal congestion and postnasal drip. 9.  Patient was stable on discharge and will follow-up here as needed.    Final Clinical Impressions(s) / UC Diagnoses   Final diagnoses:  Viral pharyngitis  Nasal congestion  Post-nasal drip  Myalgia  Viral upper respiratory tract infection     Discharge Instructions     Strep test was negative. Influenza and COVID test were both negative. Please see educational handouts. If symptoms persist please see your primary care provider. If symptoms worsen please go to the ER. I sent in a prescription to your pharmacy.  Nasal congestion and postnasal drip.  Hopefully that will help with your sore throat. Plenty of rest, plenty of fluids, Tylenol or Motrin for any fever or discomfort.    ED Prescriptions    Medication Sig Dispense Auth. Provider   fluticasone (FLONASE) 50 MCG/ACT nasal spray Place 2 sprays into both nostrils daily. 15.8 mL Delton See, MD     PDMP not reviewed this encounter.   Delton See, MD 02/05/21 [phone]

## 2021-05-05 ENCOUNTER — Other Ambulatory Visit: Payer: Self-pay

## 2021-05-05 ENCOUNTER — Ambulatory Visit
Admission: RE | Admit: 2021-05-05 | Discharge: 2021-05-05 | Disposition: A | Discharge status: Home / Self Care | Payer: BC Managed Care – PPO | Source: Ambulatory Visit | Attending: Emergency Medicine | Admitting: Emergency Medicine

## 2021-05-05 VITALS — BP 129/98 | HR 79 | Temp 98.4°F | Resp 17 | Ht 68.0 in | Wt 260.0 lb

## 2021-05-05 DIAGNOSIS — H6002 Abscess of left external ear: Secondary | ICD-10-CM

## 2021-05-05 MED ORDER — DOXYCYCLINE HYCLATE 100 MG PO CAPS: 100.0000 mg | ORAL_CAPSULE | Freq: Two times a day (BID) | ORAL | 0 refills | Status: AC

## 2021-05-05 MED ORDER — IBUPROFEN 600 MG PO TABS: 600.0000 mg | ORAL_TABLET | Freq: Four times a day (QID) | ORAL | 0 refills | Status: AC | PRN

## 2021-05-05 NOTE — ED Triage Notes (Signed)
Patient complains of left ear pain. Patient states that he did have a pimple like area in his ear and it popped 2 days ago. Since his ear has swelled and he has had constant pain, worse with laying down or pressure.

## 2021-05-05 NOTE — Discharge Instructions (Addendum)
Finish the doxycycline, 1000 mg of Tylenol combined with 600 mg of ibuprofen 3-4 times a day as needed for pain.  Continue warm compresses.

## 2021-05-05 NOTE — ED Provider Notes (Signed)
HPI  SUBJECTIVE:  Tyler Adams is a 30 y.o. male who presents with a "pimple" in his left ear canal starting 4 days ago.  He states that it popped 2 days ago and has had purulent drainage since.  He reports increased swelling at the [facility], pain along the entire ear, decreased hearing.  He wears ear buds regularly.  Denies other foreign body insertion.  No otorrhea, fevers.  No antipyretic in the past 6 hours.  He has tried warm compresses, and unknown "ear relief" drops, Tylenol with improvement in his symptoms.  Symptoms worse with pushing on his ear and lying on his left side.  Past medical history negative for MRSA, diabetes, immunocompromise, HIV.  CXK:GYJEHU, Malachi Pro, MD   History reviewed. No pertinent past medical history.  Past Surgical History:  Procedure Laterality Date   SHOULDER SURGERY     WISDOM TOOTH EXTRACTION      Family History  Problem Relation Age of Onset   Heart disease Father     Social History   Tobacco Use   Smoking status: Former   Smokeless tobacco: Never  Vaping Use   Vaping Use: Never used  Substance Use Topics   Alcohol use: Yes   Drug use: No    No current facility-administered medications for this encounter.  Current Outpatient Medications:    doxycycline (VIBRAMYCIN) 100 MG capsule, Take 1 capsule (100 mg total) by mouth 2 (two) times daily for 5 days., Disp: 10 capsule, Rfl: 0   fluticasone (FLONASE) 50 MCG/ACT nasal spray, Place 2 sprays into both nostrils daily., Disp: 15.8 mL, Rfl: 0   ibuprofen (ADVIL) 600 MG tablet, Take 1 tablet (600 mg total) by mouth every 6 (six) hours as needed., Disp: 30 tablet, Rfl: 0  No Known Allergies   ROS  As noted in HPI.   Physical Exam  BP (!) 129/98 (BP Location: Left Arm)   Pulse 79   Temp 98.4 F (36.9 C) (Oral)   Resp 17   Ht 5\' 8"  (1.727 m)   Wt 117.9 kg   SpO2 95%   BMI 39.53 kg/m   Constitutional: Well developed, well nourished, no acute distress Eyes:  EOMI, conjunctiva normal  bilaterally HENT: Normocephalic, atraumatic,mucus membranes moist.  Tender pustule left EAC with whitehead.  Positive tender swelling anterior EAC.  EAC otherwise normal.  TM normal.  External ear normal.  No pain with traction pinna, palpation of mastoid.  Pain with palpation of tragus. Respiratory: Normal inspiratory effort Cardiovascular: Normal rate GI: nondistended skin: No rash, skin intact Musculoskeletal: no deformities Neurologic: Alert & oriented x 3, no focal neuro deficits Psychiatric: Speech and behavior appropriate   ED Course   Medications - No data to display  Orders Placed This Encounter  Procedures   Aerobic Culture w Gram Stain (superficial specimen)    Standing Status:   Standing    Number of Occurrences:   1    No results found for this or any previous visit (from the past 24 hour(s)). No results found.  ED Clinical Impression  1. Abscess of ear canal, left      ED Assessment/Plan  Procedure note: Cleaned area with alcohol.  Using a sterile 18-gauge needle, made a single stab incision and expressed a small amount of pus.  Culture sent.  Patient tolerated procedure well.  Patient with a small abscess of the left EAC status post I&D.  Cultures sent.  Home with doxycycline, Tylenol/ibuprofen.  Continue warm compresses.  Follow-up with  PMD as needed.  ER return precautions given.  Discussed labs,  MDM, treatment plan, and plan for follow-up with patient. Discussed sn/sx that should prompt return to the ED. patient agrees with plan.   Meds ordered this encounter  Medications   ibuprofen (ADVIL) 600 MG tablet    Sig: Take 1 tablet (600 mg total) by mouth every 6 (six) hours as needed.    Dispense:  30 tablet    Refill:  0   doxycycline (VIBRAMYCIN) 100 MG capsule    Sig: Take 1 capsule (100 mg total) by mouth 2 (two) times daily for 5 days.    Dispense:  10 capsule    Refill:  0      *This clinic note was created using Scientist, clinical (histocompatibility and immunogenetics).  Therefore, there may be occasional mistakes despite careful proofreading.  ?    Domenick Gong, MD 05/07/21 [phone]

## 2021-05-07 LAB — AEROBIC CULTURE W GRAM STAIN (SUPERFICIAL SPECIMEN)

## 2021-07-20 ENCOUNTER — Other Ambulatory Visit: Payer: Self-pay

## 2021-07-20 ENCOUNTER — Encounter: Payer: Self-pay | Admitting: Emergency Medicine

## 2021-07-20 ENCOUNTER — Ambulatory Visit
Admission: EM | Admit: 2021-07-20 | Discharge: 2021-07-20 | Disposition: A | Discharge status: Home / Self Care | Payer: BC Managed Care – PPO | Attending: Emergency Medicine | Admitting: Emergency Medicine

## 2021-07-20 DIAGNOSIS — H66002 Acute suppurative otitis media without spontaneous rupture of ear drum, left ear: Secondary | ICD-10-CM

## 2021-07-20 MED ORDER — AMOXICILLIN-POT CLAVULANATE 875-125 MG PO TABS: 1.0000 | ORAL_TABLET | Freq: Two times a day (BID) | ORAL | 0 refills | Status: AC

## 2021-07-20 NOTE — Discharge Instructions (Signed)
Take the Augmentin twice daily for 10 days with food for treatment of your ear infection.  Take an over-the-counter probiotic 1 hour after each dose of antibiotic to prevent diarrhea.  Use over-the-counter Tylenol and ibuprofen as needed for pain or fever.  Place a hot water bottle, or heating pad, underneath your pillowcase at night to help dilate up your ear and aid in pain relief as well as resolution of the infection.  Return for reevaluation for any new or worsening symptoms.  

## 2021-07-20 NOTE — ED Provider Notes (Signed)
[facility]    CSN: [id number] Arrival date & time: 07/20/21  1420      History   Chief Complaint Chief Complaint  Patient presents with   Otalgia    left    HPI Tyler Adams is a 30 y.o. male.   HPI  8 old male here for evaluation of left ear pain.  Patient reports that he has been experiencing pain in his left ear for last 2 to 3 days.  This is not been associate with fever, drainage from the ear, or upper or lower respiratory symptoms.  Patient reports that a month ago he had an abscess in his left external auditory canal that was drained here in the urgent care.  He states he feels some tenderness to the canal and is concerned it has returned.  History reviewed. No pertinent past medical history.  There are no problems to display for this patient.   Past Surgical History:  Procedure Laterality Date   SHOULDER SURGERY     WISDOM TOOTH EXTRACTION         Home Medications    Prior to Admission medications   Medication Sig Start Date End Date Taking? Authorizing Provider  amoxicillin-clavulanate (AUGMENTIN) 875-125 MG tablet Take 1 tablet by mouth every 12 (twelve) hours for 10 days. 07/20/21 07/30/21 Yes Becky Augusta, NP  fluticasone (FLONASE) 50 MCG/ACT nasal spray Place 2 sprays into both nostrils daily. 02/05/21   Delton See, MD  ibuprofen (ADVIL) 600 MG tablet Take 1 tablet (600 mg total) by mouth every 6 (six) hours as needed. 05/05/21   Domenick Gong, MD    Family History Family History  Problem Relation Age of Onset   Heart disease Father     Social History Social History   Tobacco Use   Smoking status: Former   Smokeless tobacco: Never  Building services engineer Use: Never used  Substance Use Topics   Alcohol use: Yes   Drug use: No     Allergies   Patient has no known allergies.   Review of Systems Review of Systems  Constitutional:  Negative for activity change, appetite change and fever.  HENT:  Positive for ear  pain. Negative for ear discharge, hearing loss, rhinorrhea and sore throat.   Respiratory:  Negative for cough, shortness of breath and wheezing.   Gastrointestinal:  Negative for nausea and vomiting.  Skin:  Negative for rash.  Hematological: Negative.   Psychiatric/Behavioral: Negative.      Physical Exam Triage Vital Signs ED Triage Vitals  Enc Vitals Group     BP 07/20/21 1431 (!) 134/91     Pulse Rate 07/20/21 1431 84     Resp 07/20/21 1431 16     Temp 07/20/21 1431 98.2 F (36.8 C)     Temp Source 07/20/21 1431 Oral     SpO2 07/20/21 1431 96 %     Weight 07/20/21 1429 265 lb (120.2 kg)     Height 07/20/21 1429 5\' 8"  (1.727 m)     Head Circumference --      Peak Flow --      Pain Score 07/20/21 1429 7     Pain Loc --      Pain Edu? --      Excl. in GC? --    No data found.  Updated Vital Signs BP (!) 134/91 (BP Location: Left Arm)   Pulse 84   Temp 98.2 F (36.8 C) (Oral)   Resp 16  Ht 5\' 8"  (1.727 m)   Wt 265 lb (120.2 kg)   SpO2 96%   BMI 40.29 kg/m   Visual Acuity Right Eye Distance:   Left Eye Distance:   Bilateral Distance:    Right Eye Near:   Left Eye Near:    Bilateral Near:     Physical Exam Vitals and nursing note reviewed.  Constitutional:      General: He is not in acute distress.    Appearance: Normal appearance. He is not ill-appearing.  HENT:     Head: Normocephalic and atraumatic.     Right Ear: Tympanic membrane, ear canal and external ear normal. There is impacted cerumen.     Left Ear: Ear canal and external ear normal. There is no impacted cerumen.  Skin:    General: Skin is warm and dry.     Capillary Refill: Capillary refill takes less than 2 seconds.     Findings: No erythema or rash.  Neurological:     General: No focal deficit present.     Mental Status: He is alert and oriented to person, place, and time.  Psychiatric:        Mood and Affect: Mood normal.        Behavior: Behavior normal.        Thought Content:  Thought content normal.        Judgment: Judgment normal.     UC Treatments / Results  Labs (all labs ordered are listed, but only abnormal results are displayed) Labs Reviewed - No data to display  EKG   Radiology No results found.  Procedures Procedures (including critical care time)  Medications Ordered in UC Medications - No data to display  Initial Impression / Assessment and Plan / UC Course  I have reviewed the triage vital signs and the nursing notes.  Pertinent labs & imaging results that were available during my care of the patient were reviewed by me and considered in my medical decision making (see chart for details).  Patient is a nontoxic-appearing 30 year old male here for evaluation of left ear pain for the past 2 to 3 days.  No other associated upper respiratory symptoms, lower respiratory symptoms, or GI concerns voiced by the patient.  He is concerned that he has a return of his external auditory canal abscess that he had last month that was drained here in the urgent care.  Physical exam reveals an unremarkable left external auditory canal.  The left tympanic membrane is erythematous with no effusion noted.  Right TM is pearly gray with normal light reflex and clear external auditory canal.  Patient exam is consistent with otitis media.  We will treat with Augmentin twice daily for 10 days for treatment the otitis media.  Patient vies to return for reevaluation for new or worsening symptoms.   Final Clinical Impressions(s) / UC Diagnoses   Final diagnoses:  Non-recurrent acute suppurative otitis media of left ear without spontaneous rupture of tympanic membrane     Discharge Instructions      Take the Augmentin twice daily for 10 days with food for treatment of your ear infection.  Take an over-the-counter probiotic 1 hour after each dose of antibiotic to prevent diarrhea.  Use over-the-counter Tylenol and ibuprofen as needed for pain or fever.  Place  a hot water bottle, or heating pad, underneath your pillowcase at night to help dilate up your ear and aid in pain relief as well as resolution of the infection.  Return  for reevaluation for any new or worsening symptoms.      ED Prescriptions     Medication Sig Dispense Auth. Provider   amoxicillin-clavulanate (AUGMENTIN) 875-125 MG tablet Take 1 tablet by mouth every 12 (twelve) hours for 10 days. 20 tablet Becky Augusta, NP      PDMP not reviewed this encounter.   Becky Augusta, NP 07/20/21 1448

## 2021-07-20 NOTE — ED Triage Notes (Signed)
Patient c/o left ear pain that started 2-3 days ago.  Patient denies fevers.

## 2021-09-20 ENCOUNTER — Ambulatory Visit
Admission: EM | Admit: 2021-09-20 | Discharge: 2021-09-20 | Disposition: A | Discharge status: Home / Self Care | Payer: 59 | Attending: Physician Assistant | Admitting: Physician Assistant

## 2021-09-20 ENCOUNTER — Other Ambulatory Visit: Payer: Self-pay

## 2021-09-20 DIAGNOSIS — H1032 Unspecified acute conjunctivitis, left eye: Secondary | ICD-10-CM

## 2021-09-20 MED ORDER — CIPROFLOXACIN HCL 0.3 % OP SOLN: OPHTHALMIC | 0 refills | Status: AC

## 2021-09-20 NOTE — ED Provider Notes (Signed)
[facility]    CSN: [id number] Arrival date & time: 09/20/21  [id number]      History   Chief Complaint Chief Complaint  Patient presents with   Conjunctivitis    HPI Tyler Adams is a 31 y.o. male presenting for left eye redness, pruritus and thick whitish-yellow drainage since yesterday.  Denies pain but says his eye is uncomfortable.  No light sensitivity.  Feels swollen if needed.  Has used over-the-counter eyedrops without improvement in symptoms.  Does not report any other symptoms which is cough, congestion, sore throat.  Not a contact lens wearer.  No other complaints.  HPI  History reviewed. No pertinent past medical history.  There are no problems to display for this patient.   Past Surgical History:  Procedure Laterality Date   SHOULDER SURGERY     WISDOM TOOTH EXTRACTION         Home Medications    Prior to Admission medications   Medication Sig Start Date End Date Taking? Authorizing Provider  ciprofloxacin (CILOXAN) 0.3 % ophthalmic solution Administer 1-2 drop every 2 hours, while awake, for 2 days. Then 1-2 drop every 4 hours, while awake, for the next 5 days. 09/20/21 09/27/21 Yes Laurene Footman B, PA-C  fluticasone (FLONASE) 50 MCG/ACT nasal spray Place 2 sprays into both nostrils daily. 02/05/21  Yes Verda Cumins, MD  ibuprofen (ADVIL) 600 MG tablet Take 1 tablet (600 mg total) by mouth every 6 (six) hours as needed. 05/05/21  Yes Melynda Ripple, MD    Family History Family History  Problem Relation Age of Onset   Heart disease Father     Social History Social History   Tobacco Use   Smoking status: Former   Smokeless tobacco: Never  Scientific laboratory technician Use: Never used  Substance Use Topics   Alcohol use: Yes   Drug use: No     Allergies   Patient has no known allergies.   Review of Systems Review of Systems  Constitutional:  Negative for fatigue and fever.  HENT:  Negative for congestion and sore throat.   Eyes:   Positive for discharge, redness and itching. Negative for photophobia, pain and visual disturbance.  Respiratory:  Negative for cough.   Neurological:  Negative for dizziness and headaches.    Physical Exam Triage Vital Signs ED Triage Vitals  Enc Vitals Group     BP 09/20/21 0920 (!) 123/93     Pulse Rate 09/20/21 0920 75     Resp 09/20/21 0920 18     Temp 09/20/21 0920 98.9 F (37.2 C)     Temp Source 09/20/21 0920 Oral     SpO2 09/20/21 0920 98 %     Weight 09/20/21 0919 260 lb (117.9 kg)     Height 09/20/21 0919 5\' 8"  (1.727 m)     Head Circumference --      Peak Flow --      Pain Score 09/20/21 0919 4     Pain Loc --      Pain Edu? --      Excl. in [place]? --    No data found.  Updated Vital Signs BP (!) 123/93 (BP Location: Left Arm)    Pulse 75    Temp 98.9 F (37.2 C) (Oral)    Resp 18    Ht 5\' 8"  (1.727 m)    Wt 260 lb (117.9 kg)    SpO2 98%    BMI 39.53 kg/m   Visual Acuity  Right Eye Distance:   Left Eye Distance:   Bilateral Distance:    Right Eye Near:   Left Eye Near:    Bilateral Near:     Physical Exam Vitals and nursing note reviewed.  Constitutional:      General: He is not in acute distress.    Appearance: Normal appearance. He is well-developed. He is not ill-appearing.  HENT:     Head: Normocephalic and atraumatic.     Nose: Nose normal.     Mouth/Throat:     Mouth: Mucous membranes are moist.     Pharynx: Oropharynx is clear.  Eyes:     General: No scleral icterus.       Left eye: Discharge (thick yellow discharge and crusting corner of eye) present.    Extraocular Movements: Extraocular movements intact.     Conjunctiva/sclera:     Left eye: Left conjunctiva is injected.     Pupils: Pupils are equal, round, and reactive to light.  Cardiovascular:     Rate and Rhythm: Normal rate.  Pulmonary:     Effort: Pulmonary effort is normal. No respiratory distress.  Musculoskeletal:        General: No swelling.     Cervical back: Neck supple.   Skin:    General: Skin is warm and dry.     Capillary Refill: Capillary refill takes less than 2 seconds.  Neurological:     General: No focal deficit present.     Mental Status: He is alert. Mental status is at baseline.     Motor: No weakness.     Coordination: Coordination normal.     Gait: Gait normal.  Psychiatric:        Mood and Affect: Mood normal.        Thought Content: Thought content normal.     UC Treatments / Results  Labs (all labs ordered are listed, but only abnormal results are displayed) Labs Reviewed - No data to display  EKG   Radiology No results found.  Procedures Procedures (including critical care time)  Medications Ordered in UC Medications - No data to display  Initial Impression / Assessment and Plan / UC Course  I have reviewed the triage vital signs and the nursing notes.  Pertinent labs & imaging results that were available during my care of the patient were reviewed by me and considered in my medical decision making (see chart for details).  31 year old male presenting for left eye redness, pruritus and drainage since yesterday.  Clinical presentation is consistent with acute conjunctivitis, suspect bacterial cause.  Treating at this time with Ciloxan eyedrops.  Also reviewed supportive care with cool or warm compresses for comfort, over-the-counter redness relief eyedrops.  Discussed with patient that this is very contagious to others and to practice good hand hygiene.  Reviewed return precautions.   Final Clinical Impressions(s) / UC Diagnoses   Final diagnoses:  Acute bacterial conjunctivitis of left eye   Discharge Instructions   None    ED Prescriptions     Medication Sig Dispense Auth. Provider   ciprofloxacin (CILOXAN) 0.3 % ophthalmic solution Administer 1-2 drop every 2 hours, while awake, for 2 days. Then 1-2 drop every 4 hours, while awake, for the next 5 days. 5 mL Danton Clap, PA-C      PDMP not reviewed this  encounter.   Danton Clap, PA-C 09/20/21 [id number]

## 2021-09-20 NOTE — ED Triage Notes (Signed)
Pt states that his eye was itching on 09/19/20 and woke up this morning with it crusted shut and is worried about pink eye.

## 2022-12-30 ENCOUNTER — Ambulatory Visit: Payer: 59 | Admitting: Family Medicine

## 2022-12-30 ENCOUNTER — Encounter: Payer: Self-pay | Admitting: Family Medicine

## 2022-12-30 VITALS — BP 120/80 | HR 78 | Ht 68.0 in | Wt 253.0 lb

## 2022-12-30 DIAGNOSIS — M5417 Radiculopathy, lumbosacral region: Secondary | ICD-10-CM | POA: Insufficient documentation

## 2022-12-30 MED ORDER — MELOXICAM 15 MG PO TABS
15.0000 mg | ORAL_TABLET | Freq: Every day | ORAL | 0 refills | Status: DC | PRN
Start: 2022-12-30 — End: 2023-01-26

## 2022-12-30 MED ORDER — GABAPENTIN 100 MG PO CAPS
100.0000 mg | ORAL_CAPSULE | Freq: Every day | ORAL | 0 refills | Status: AC
Start: 2022-12-30 — End: ?

## 2022-12-30 MED ORDER — PREDNISONE 50 MG PO TABS
50.0000 mg | ORAL_TABLET | Freq: Every day | ORAL | 0 refills | Status: AC
Start: 2022-12-30 — End: ?

## 2022-12-30 NOTE — Progress Notes (Signed)
     Primary Care / Sports Medicine Office Visit  Patient Information:  Patient ID: Tyler Adams, male DOB: 11-01-1990 Age: 32 y.o. MRN: [id number]   Tyler Adams is a pleasant 32 y.o. male presenting with the following:  Chief Complaint  Patient presents with   Back Pain    Couple months, went to PT, woke up Thursday in severe pain unable to move.     Vitals:   12/30/22 1119  BP: 120/80  Pulse: 78  SpO2: 98%   Vitals:   12/30/22 1119  Weight: 253 lb (114.8 kg)  Height:  (1.727 m)   Body mass index is 38.47 kg/m.  No results found.   Independent interpretation of notes and tests performed by another provider:   Independent interpretation of 2 view lumbar spine from [facility] reveals maintained alignment, no intervertebral space loss, no facet hypertrophy, no listhesis, normal x-rays.  Procedures performed:   None  Pertinent History, Exam, Impression, and Recommendations:   Tyler Adams was seen today for back pain.  Lumbosacral radiculopathy Assessment & Plan: 2 year history of atraumatic low back pain, ties onset to birth of child and associated physical demands. Has been performing PT for months with initial improvement until last week when he began to note progressive worsening. Did see outside orthopedic group who ordered x-rays, started Medrol Pak, Flexeril, and he has noted mild improvement, still with left sided thigh and intermittent left foot paresthesias.  Examination with pain during lumbar flexion, +SLR left, +iliopsoas testing, L > R, paraspinal spasm, sensorimotor intact, provocative testing otherwise benign.  Given reassuring x-rays, clinical history and current findings despite treatments, plan as follows:  - Stop prior Medrol Dosepak - Start prednisone 50 mg daily x 3-5 days (stop when symptoms improve) - If symptoms persist past prednisone, dose meloxicam daily  - Start nightly gabapentin - Can continue cyclobenzaprine (muscle relaxer)  as-needed - Gentle activity as tolerated  Orders: -     MR LUMBAR SPINE WO CONTRAST; Future -     predniSONE; Take 1 tablet (50 mg total) by mouth daily.  Dispense: 5 tablet; Refill: 0 -     Gabapentin; Take 1 capsule (100 mg total) by mouth at bedtime.  Dispense: 21 capsule; Refill: 0 -     Meloxicam; Take 1 tablet (15 mg total) by mouth daily as needed for pain.  Dispense: 30 tablet; Refill: 0     Orders & Medications Meds ordered this encounter  Medications   predniSONE (DELTASONE) 50 MG tablet    Sig: Take 1 tablet (50 mg total) by mouth daily.    Dispense:  5 tablet    Refill:  0   gabapentin (NEURONTIN) 100 MG capsule    Sig: Take 1 capsule (100 mg total) by mouth at bedtime.    Dispense:  21 capsule    Refill:  0   meloxicam (MOBIC) 15 MG tablet    Sig: Take 1 tablet (15 mg total) by mouth daily as needed for pain.    Dispense:  30 tablet    Refill:  0   Orders Placed This Encounter  Procedures   MR Lumbar Spine Wo Contrast     No follow-ups on file.     Jerrol Banana, MD, [facility]   Primary Care Sports Medicine Primary Care and Sports Medicine at [facility]

## 2022-12-30 NOTE — Assessment & Plan Note (Signed)
2 year history of atraumatic low back pain, ties onset to birth of child and associated physical demands. Has been performing PT for months with initial improvement until last week when he began to note progressive worsening. Did see outside orthopedic group who ordered x-rays, started Medrol Pak, Flexeril, and he has noted mild improvement, still with left sided thigh and intermittent left foot paresthesias.  Examination with pain during lumbar flexion, +SLR left, +iliopsoas testing, L > R, paraspinal spasm, sensorimotor intact, provocative testing otherwise benign.  Given reassuring x-rays, clinical history and current findings despite treatments, plan as follows:  - Stop prior Medrol Dosepak - Start prednisone 50 mg daily x 3-5 days (stop when symptoms improve) - If symptoms persist past prednisone, dose meloxicam daily  - Start nightly gabapentin - Can continue cyclobenzaprine (muscle relaxer) as-needed - Gentle activity as tolerated

## 2022-12-30 NOTE — Patient Instructions (Signed)
-   Referral coordinator will contact you regarding MRI scheduling - Stop prior Medrol Dosepak - Start prednisone 50 mg daily x 3-5 days (stop when symptoms improve) - If symptoms persist past prednisone, dose meloxicam daily  - Start nightly gabapentin - Can continue cyclobenzaprine (muscle relaxer) as-needed - Gentle activity as tolerated - We will reach out with MRI results to discuss next steps, contact us for questions

## 2023-01-01 ENCOUNTER — Encounter: Payer: Self-pay | Admitting: Family Medicine

## 2023-01-06 ENCOUNTER — Ambulatory Visit
Admission: RE | Admit: 2023-01-06 | Discharge: 2023-01-06 | Disposition: A | Discharge status: Home / Self Care | Payer: 59 | Source: Ambulatory Visit | Attending: Family Medicine | Admitting: Family Medicine

## 2023-01-06 DIAGNOSIS — M5417 Radiculopathy, lumbosacral region: Secondary | ICD-10-CM | POA: Diagnosis present

## 2023-01-12 ENCOUNTER — Other Ambulatory Visit: Payer: Self-pay | Admitting: Family Medicine

## 2023-01-12 ENCOUNTER — Encounter: Payer: Self-pay | Admitting: Family Medicine

## 2023-01-12 DIAGNOSIS — M5417 Radiculopathy, lumbosacral region: Secondary | ICD-10-CM

## 2023-01-12 NOTE — Telephone Encounter (Signed)
Please advise 

## 2023-01-18 ENCOUNTER — Other Ambulatory Visit: Payer: Self-pay | Admitting: Family Medicine

## 2023-01-18 DIAGNOSIS — M5417 Radiculopathy, lumbosacral region: Secondary | ICD-10-CM

## 2023-01-26 ENCOUNTER — Other Ambulatory Visit: Payer: Self-pay | Admitting: Family Medicine

## 2023-01-26 DIAGNOSIS — M5417 Radiculopathy, lumbosacral region: Secondary | ICD-10-CM

## 2023-01-26 NOTE — Telephone Encounter (Signed)
Requested medication (s) are due for refill today: yes  Requested medication (s) are on the active medication list: yes  Last refill:  12/30/22  Future visit scheduled: no  Notes to clinic:  Unable to refill per protocol due to failed labs, no updated results. Routing for approval.      Requested Prescriptions  Pending Prescriptions Disp Refills   meloxicam (MOBIC) 15 MG tablet [Pharmacy Med Name: MELOXICAM 15 MG TABLET] 30 tablet 0    Sig: TAKE 1 TABLET BY MOUTH EVERY DAY AS NEEDED FOR PAIN     Analgesics:  COX2 Inhibitors Failed - 01/26/2023  2:39 AM      Failed - Manual Review: Labs are only required if the patient has taken medication for more than 8 weeks.      Failed - HGB in normal range and within 360 days    Hemoglobin  Date Value Ref Range Status  03/08/2017 14.9 13.0 - 17.0 g/dL Final         Failed - Cr in normal range and within 360 days    Creatinine, Ser  Date Value Ref Range Status  03/08/2017 1.03 0.61 - 1.24 mg/dL Final         Failed - HCT in normal range and within 360 days    HCT  Date Value Ref Range Status  03/08/2017 44.9 39.0 - 52.0 % Final         Failed - AST in normal range and within 360 days    No results found for: "POCAST", "AST"       Failed - ALT in normal range and within 360 days    No results found for: "ALT", "LABALT", "POCALT"       Failed - eGFR is 30 or above and within 360 days    GFR calc Af Amer  Date Value Ref Range Status  03/08/2017 >60 >60 mL/min Final    Comment:    (NOTE) The eGFR has been calculated using the CKD EPI equation. This calculation has not been validated in all clinical situations. eGFR's persistently <60 mL/min signify possible Chronic Kidney Disease.    GFR calc non Af Amer  Date Value Ref Range Status  03/08/2017 >60 >60 mL/min Final         Failed - Valid encounter within last 12 months    Recent Outpatient Visits           3 weeks ago Lumbosacral radiculopathy   [place] [facility], Ocie Bob, MD              Passed - Patient is not pregnant

## 2023-01-27 ENCOUNTER — Ambulatory Visit: Payer: 59 | Attending: Family Medicine | Admitting: Physical Therapy

## 2023-01-27 DIAGNOSIS — M6281 Muscle weakness (generalized): Secondary | ICD-10-CM

## 2023-01-27 DIAGNOSIS — M5417 Radiculopathy, lumbosacral region: Secondary | ICD-10-CM

## 2023-01-27 DIAGNOSIS — M5459 Other low back pain: Secondary | ICD-10-CM

## 2023-01-27 DIAGNOSIS — M2560 Stiffness of unspecified joint, not elsewhere classified: Secondary | ICD-10-CM | POA: Diagnosis present

## 2023-01-30 NOTE — Therapy (Signed)
OUTPATIENT PHYSICAL THERAPY THORACOLUMBAR EVALUATION   Patient Name: Rafay Sare MRN: [id number] DOB:23-May-1991, 32 y.o., male Today's Date: 01/27/2023  END OF SESSION:  PT End of Session - 01/30/23 2107     Visit Number 1    Number of Visits 13    Date for PT Re-Evaluation 03/10/23    PT Start Time 0733    PT Stop Time 0819    PT Time Calculation (min) 46 min             No past medical history on file. Past Surgical History:  Procedure Laterality Date   SHOULDER SURGERY     WISDOM TOOTH EXTRACTION     Patient Active Problem List   Diagnosis Date Noted   Lumbosacral radiculopathy 12/30/2022   PCP: Bernerd Limbo, MD  REFERRING PROVIDER: Jerrol Banana, MD  REFERRING DIAG: Lumbosacral radiculopathy due to intervertebral disc disorder  Rationale for Evaluation and Treatment: Rehabilitation  THERAPY DIAG:  Lumbosacral radiculopathy  Other low back pain  Joint stiffness of spine  Muscle weakness (generalized)  ONSET DATE: >2 years (chronic)- started around birth of child  SUBJECTIVE:                                                                                                                                                                                           SUBJECTIVE STATEMENT: Pt. Discussed chronicity of low back symptoms over past 2 years. Pt. Went to PT at [facility] (03/2022) and then [facility] (07/2022).  Pt. States he "tweaked" back 6 weeks ago and fell on back yesterday after slipping on outside stairs.  Pt. Has a  standing desk at home for work.  Pt. Has used MH in past, not ice.  Pt. Reports 6/10 low back pain currently at rest, 3/10 pain at best.  Pt. Sits with moderate R lateral lean in chair.  Pt. States Prednisone has helped in past.    PERTINENT HISTORY:  Expand All Collapse All       Primary Care / Sports Medicine Office Visit   Patient Information:  Patient ID: Cliff Rothfeld, male DOB: 1991/06/30 Age: 32 y.o. MRN: [id number]     Pasqual Bandy is a pleasant 32 y.o. male presenting with the following:       Chief Complaint  Patient presents with   Back Pain      Couple months, went to PT, woke up Thursday in severe pain unable to move.          Vitals:    12/30/22 1119  BP: 120/80  Pulse: 78  SpO2: 98%       Vitals:  12/30/22 1119  Weight: 253 lb (114.8 kg)  Height: 5\' 8"  (1.727 m)    Body mass index is 38.47 kg/m.   Imaging Results  No results found.      Independent interpretation of notes and tests performed by another provider:    Independent interpretation of 2 view lumbar spine from [facility] reveals maintained alignment, no intervertebral space loss, no facet hypertrophy, no listhesis, normal x-rays.   Procedures performed:    None   Pertinent History, Exam, Impression, and Recommendations:    Stewart was seen today for back pain.   Lumbosacral radiculopathy Assessment & Plan: 2 year history of atraumatic low back pain, ties onset to birth of child and associated physical demands. Has been performing PT for months with initial improvement until last week when he began to note progressive worsening. Did see outside orthopedic group who ordered x-rays, started Medrol Pak, Flexeril, and he has noted mild improvement, still with left sided thigh and intermittent left foot paresthesias.   Examination with pain during lumbar flexion, +SLR left, +iliopsoas testing, L > R, paraspinal spasm, sensorimotor intact, provocative testing otherwise benign.   Given reassuring x-rays, clinical history and current findings despite treatments, plan as follows:   - Stop prior Medrol Dosepak - Start prednisone 50 mg daily x 3-5 days (stop when symptoms improve) - If symptoms persist past prednisone, dose meloxicam daily  - Start nightly gabapentin - Can continue cyclobenzaprine (muscle relaxer) as-needed - Gentle activity as tolerated   Orders: -     MR LUMBAR SPINE WO CONTRAST; Future -      predniSONE; Take 1 tablet (50 mg total) by mouth daily.  Dispense: 5 tablet; Refill: 0 -     Gabapentin; Take 1 capsule (100 mg total) by mouth at bedtime.  Dispense: 21 capsule; Refill: 0 -     Meloxicam; Take 1 tablet (15 mg total) by mouth daily as needed for pain.  Dispense: 30 tablet; Refill: 0         Orders & Medications     Meds ordered this encounter  Medications   predniSONE (DELTASONE) 50 MG tablet      Sig: Take 1 tablet (50 mg total) by mouth daily.      Dispense:  5 tablet      Refill:  0   gabapentin (NEURONTIN) 100 MG capsule      Sig: Take 1 capsule (100 mg total) by mouth at bedtime.      Dispense:  21 capsule      Refill:  0   meloxicam (MOBIC) 15 MG tablet      Sig: Take 1 tablet (15 mg total) by mouth daily as needed for pain.      Dispense:  30 tablet      Refill:  0       Orders Placed This Encounter  Procedures   MR Lumbar Spine Wo Contrast      No follow-ups on file.      Jerrol Banana, MD, CAQSM    Primary Care Sports Medicine Primary Care and Sports Medicine at [facility]         Assessment & Plan Note by Jerrol Banana, MD at 12/30/2022 11:54 AM  Author: Jerrol Banana, MD Author Type: Physician Filed: 12/30/2022 11:54 AM  Note Status: Written Cosign: Cosign Not Required Encounter Date: 12/30/2022  Problem: Lumbosacral radiculopathy  Editor: Jerrol Banana, MD (Physician)  2 year history of atraumatic low back pain, ties onset to birth of child and associated physical demands. Has been performing PT for months with initial improvement until last week when he began to note progressive worsening. Did see outside orthopedic group who ordered x-rays, started Medrol Pak, Flexeril, and he has noted mild improvement, still with left sided thigh and intermittent left foot paresthesias.   Examination with pain during lumbar flexion, +SLR left, +iliopsoas testing, L > R, paraspinal spasm, sensorimotor intact, provocative  testing otherwise benign.   Given reassuring x-rays, clinical history and current findings despite treatments, plan as follows:   - Stop prior Medrol Dosepak - Start prednisone 50 mg daily x 3-5 days (stop when symptoms improve) - If symptoms persist past prednisone, dose meloxicam daily  - Start nightly gabapentin - Can continue cyclobenzaprine (muscle relaxer) as-needed - Gentle activity as tolerated       PAIN:  Are you having pain? Yes: NPRS scale: 6/10 Pain location: L low back Pain description: deep/radiating Aggravating factors: increase activity Relieving factors: meds/ rest  PRECAUTIONS: None  WEIGHT BEARING RESTRICTIONS: No  FALLS:  Has patient fallen in last 6 months? Yes. Number of falls 1 (yesterday on outside stairs/ landed directly on back)- no bruising noted.   LIVING ENVIRONMENT: Lives with: lives with their family Lives in: House/apartment Stairs: Yes: External: 3 steps; on right going up Has following equipment at home: None  OCCUPATION: Works remotely from home  PLOF: Independent  PATIENT GOALS: Decrease back pain/ complete everyday tasks with no pain  NEXT MD VISIT: PRN  OBJECTIVE:   DIAGNOSTIC FINDINGS:  EXAM: MRI LUMBAR SPINE WITHOUT CONTRAST   TECHNIQUE: Multiplanar, multisequence MR imaging of the lumbar spine was performed. No intravenous contrast was administered.   COMPARISON:  None Available.   FINDINGS: Segmentation: Standard. Lowest well-formed disc space labeled the L5-S1 level.   Alignment: Physiologic with preservation of the normal lumbar lordosis. No listhesis.   Vertebrae: Vertebral body height maintained without acute or chronic fracture. Bone marrow signal intensity within normal limits. No discrete or worrisome osseous lesions. No abnormal marrow edema.   Conus medullaris and cauda equina: Conus extends to the T12 level. Conus and cauda equina appear normal.   Paraspinal and other soft tissues: Unremarkable.    Disc levels:   L1-2:  Unremarkable.   L2-3:  Unremarkable.   L3-4: Disc desiccation with mild disc bulge. No spinal stenosis. Foramina remain patent.   L4-5: Disc desiccation. Superimposed central disc protrusion indents the ventral thecal sac (series 5, image 28). Protruding disc closely approximates the descending L5 nerve roots without frank impingement or displacement. Resultant mild narrowing of the lateral recesses bilaterally. Central canal remains patent. No significant foraminal stenosis.   L5-S1:  Unremarkable.   IMPRESSION: 1. Central disc protrusion at L4-5, closely approximating and potentially irritating either of the descending L5 nerve roots. No frank neural impingement. 2. Mild disc bulge at L3-4 without stenosis or impingement.     Electronically Signed   By: Rise Mu M.D.   On: 01/09/2023 00:17  PATIENT SURVEYS:  FOTO initial 49/ goal 63  SCREENING FOR RED FLAGS: Bowel or bladder incontinence: No Spinal tumors: No Cauda equina syndrome: No Compression fracture: No Abdominal aneurysm: No  COGNITION: Overall cognitive status: Within functional limits for tasks assessed     SENSATION: WFL  MUSCLE LENGTH: Hamstrings: Right 54 deg; Left 62 deg (pain) Thomas test: NT  POSTURE: rounded shoulders.  Seated with R lateral lean  PALPATION: (+)  L lumbar/ SI tenderness.  Prone low thoracic/lumbar spine hypomobility (generalized).  No reproduction of radicular symptoms.    LUMBAR ROM:   AROM eval  Flexion 50% limited  Extension 25% limited  Right lateral flexion WFL  Left lateral flexion WFL  Right rotation [facility] ("feels good")  Left rotation WFL    (Blank rows = not tested)  LOWER EXTREMITY ROM:     Active  Right eval Left eval  Hip flexion [facility]  Hip extension [facility]  Hip abduction [facility]  Hip adduction    Hip internal rotation [facility] WFL  Hip external rotation [facility]  Knee flexion [facility] WFL  Knee extension [facility] WFL   Ankle dorsiflexion    Ankle plantarflexion    Ankle inversion    Ankle eversion     (Blank rows = not tested)  LOWER EXTREMITY MMT:    MMT Right eval Left eval  Hip flexion 4/5 4/5  Hip extension    Hip abduction 4/5 4/5  Hip adduction    Hip internal rotation    Hip external rotation    Knee flexion 5/5 5/5  Knee extension 5/5 5/5  Ankle dorsiflexion    Ankle plantarflexion    Ankle inversion    Ankle eversion     (Blank rows = not tested)  LUMBAR SPECIAL TESTS:  Straight leg raise test: Positive, SI Compression/distraction test: Negative, and FABER test: Negative  FUNCTIONAL TESTS:  5 times sit to stand: TBD  GAIT: Distance walked: in clinic Assistive device utilized: None Level of assistance: Complete Independence Comments: slight L antalgic gait with increase steps after standing.    TODAY'S TREATMENT:                                                                                                                              DATE: 01/27/2023  Evaluation/ see HEP   PATIENT EDUCATION:  Education details: Access Code: [email or web address] discussed previous PT at [facility] and [facility] Person educated: Patient Education method: Explanation, Demonstration, and Handouts Education comprehension: verbalized understanding and returned demonstration  HOME EXERCISE PROGRAM: Access Code: [email or web address] URL: [email or web address] Date: 01/27/2023 Prepared by: Dorene Grebe  Exercises - Prone Press Up  - 2 x daily - 7 x weekly - 1 sets - 5 reps - 10 seconds hold - Supine Hamstring Stretch with Strap  - 2 x daily - 7 x weekly - 1 sets - 4 reps - 30 seconds hold - Supine Figure 4 Piriformis Stretch  - 2 x daily - 7 x weekly - 1 sets - 4 reps - 30 seconds hold - Supine Lower Trunk Rotation  - 2 x daily - 7 x weekly - 1 sets - 10 reps - Standing Lumbar Extension  - 2 x daily - 7 x weekly - 1 sets - 5 reps  ASSESSMENT:  CLINICAL IMPRESSION: Patient is a pleasant 32 y.o. male  who was seen today for physical therapy evaluation and  treatment for low back pain with L LE radicular symptoms into L lateral thigh.  Pt. Reports 6/10 low back pain at rest and 3/10 pain at best.  Pt. Presents with 50% limitation in flexion and 25% limitation in lumbar extension.  Pt. Has (+) L SLR test and limited in B hamstring length/ neural glides.  Pt. Will benefit from skilled PT services to increase lumbar ROM/ hip and core muscle strength to improve pain-free mobility.    OBJECTIVE IMPAIRMENTS: Abnormal gait, decreased activity tolerance, decreased mobility, difficulty walking, decreased ROM, decreased strength, hypomobility, impaired flexibility, improper body mechanics, postural dysfunction, and pain.   ACTIVITY LIMITATIONS: carrying, lifting, bending, sitting, standing, and locomotion level  PARTICIPATION LIMITATIONS: community activity, occupation, and yard work  PERSONAL FACTORS: Past/current experiences are also affecting patient's functional outcome.   REHAB POTENTIAL: Good  CLINICAL DECISION MAKING: Evolving/moderate complexity  EVALUATION COMPLEXITY: Moderate   GOALS: Goals reviewed with patient? Yes  SHORT TERM GOALS: Target date: 02/17/23  Pt. Independent with HEP to increase core/ hip strength 1/2 muscle grade to improve pain-free mobility.   Baseline:  See above Goal status: INITIAL   LONG TERM GOALS: Target date: 03/10/23  Pt. Will increase FOTO to 63 to improve pain-free mobility.   Baseline: initial 49 Goal status: INITIAL  2.  Pt. Will reports no L LE/lateral thigh radicular symptoms with daily tasks consistently for a week to improve pain-free mobility.   Baseline: L lateral thigh radicular symptoms.  Goal status: INITIAL  3.  Pt. Will report <3/10 low back pain at worst with household/ work-related tasks.   Baseline: 6/10 pain at rest and >8/10 at worst.  Goal status: INITIAL   PLAN:  PT FREQUENCY: 2x/week  PT DURATION: 6 weeks  PLANNED  INTERVENTIONS: Therapeutic exercises, Therapeutic activity, Neuromuscular re-education, Balance training, Gait training, Patient/Family education, Self Care, Joint mobilization, Dry Needling, Electrical stimulation, Spinal mobilization, Cryotherapy, Moist heat, Manual therapy, and Re-evaluation.  PLAN FOR NEXT SESSION: Reassess lumbar ROM/ hamstring length  Cammie Mcgee, PT, DPT # [phone] 01/30/2023, 9:10 PM

## 2023-01-31 NOTE — Addendum Note (Signed)
Addended by: Cammie Mcgee on: 01/31/2023 06:15 PM   Modules accepted: Orders

## 2023-02-01 ENCOUNTER — Ambulatory Visit: Payer: 59 | Admitting: Physical Therapy

## 2023-02-01 ENCOUNTER — Encounter: Payer: Self-pay | Admitting: Physical Therapy

## 2023-02-01 DIAGNOSIS — M5417 Radiculopathy, lumbosacral region: Secondary | ICD-10-CM | POA: Diagnosis not present

## 2023-02-01 DIAGNOSIS — M5459 Other low back pain: Secondary | ICD-10-CM

## 2023-02-01 DIAGNOSIS — M6281 Muscle weakness (generalized): Secondary | ICD-10-CM

## 2023-02-01 DIAGNOSIS — M2560 Stiffness of unspecified joint, not elsewhere classified: Secondary | ICD-10-CM

## 2023-02-01 NOTE — Therapy (Signed)
OUTPATIENT PHYSICAL THERAPY THORACOLUMBAR TREATMENT   Patient Name: Tyler Adams MRN: [id number] DOB:12/13/1990, 32 y.o., male Today's Date: 02/01/2023  END OF SESSION:  PT End of Session - 02/01/23 0735     Visit Number 2    Number of Visits 13    Date for PT Re-Evaluation 03/10/23    PT Start Time 0729    PT Stop Time 0820    PT Time Calculation (min) 51 min             History reviewed. No pertinent past medical history. Past Surgical History:  Procedure Laterality Date   SHOULDER SURGERY     WISDOM TOOTH EXTRACTION     Patient Active Problem List   Diagnosis Date Noted   Lumbosacral radiculopathy 12/30/2022   PCP: Bernerd Limbo, MD  REFERRING PROVIDER: Jerrol Banana, MD  REFERRING DIAG: Lumbosacral radiculopathy due to intervertebral disc disorder  Rationale for Evaluation and Treatment: Rehabilitation  THERAPY DIAG:  Lumbosacral radiculopathy  Other low back pain  Joint stiffness of spine  Muscle weakness (generalized)  ONSET DATE: >2 years (chronic)- started around birth of child  SUBJECTIVE:                                                                                                                                                                                           SUBJECTIVE STATEMENT: Pt. Discussed chronicity of low back symptoms over past 2 years. Pt. Went to PT at [facility] (03/2022) and then [facility] (07/2022).  Pt. States he "tweaked" back 6 weeks ago and fell on back yesterday after slipping on outside stairs.  Pt. Has a  standing desk at home for work.  Pt. Has used MH in past, not ice.  Pt. Reports 6/10 low back pain currently at rest, 3/10 pain at best.  Pt. Sits with moderate R lateral lean in chair.  Pt. States Prednisone has helped in past.    PERTINENT HISTORY:  Expand All Collapse All       Primary Care / Sports Medicine Office Visit   Patient Information:  Patient ID: Tyler Adams, male DOB: [birth year] Age: 32 y.o. MRN:  [id number]    [name] is a pleasant 32 y.o. male presenting with the following:       Chief Complaint  Patient presents with   Back Pain      Couple months, went to PT, woke up Thursday in severe pain unable to move.          Vitals:    12/30/22 1119  BP: 120/80  Pulse: 78  SpO2: 98%       Vitals:  12/30/22 1119  Weight: 253 lb (114.8 kg)  Height: 5\' 8"  (1.727 m)    Body mass index is 38.47 kg/m.   Imaging Results  No results found.      Independent interpretation of notes and tests performed by another provider:    Independent interpretation of 2 view lumbar spine from [facility] reveals maintained alignment, no intervertebral space loss, no facet hypertrophy, no listhesis, normal x-rays.   Procedures performed:    None   Pertinent History, Exam, Impression, and Recommendations:    Tyler Adams was seen today for back pain.   Lumbosacral radiculopathy Assessment & Plan: 2 year history of atraumatic low back pain, ties onset to birth of child and associated physical demands. Has been performing PT for months with initial improvement until last week when he began to note progressive worsening. Did see outside orthopedic group who ordered x-rays, started Medrol Pak, Flexeril, and he has noted mild improvement, still with left sided thigh and intermittent left foot paresthesias.   Examination with pain during lumbar flexion, +SLR left, +iliopsoas testing, L > R, paraspinal spasm, sensorimotor intact, provocative testing otherwise benign.   Given reassuring x-rays, clinical history and current findings despite treatments, plan as follows:   - Stop prior Medrol Dosepak - Start prednisone 50 mg daily x 3-5 days (stop when symptoms improve) - If symptoms persist past prednisone, dose meloxicam daily  - Start nightly gabapentin - Can continue cyclobenzaprine (muscle relaxer) as-needed - Gentle activity as tolerated   Orders: -     MR LUMBAR SPINE WO CONTRAST;  Future -     predniSONE; Take 1 tablet (50 mg total) by mouth daily.  Dispense: 5 tablet; Refill: 0 -     Gabapentin; Take 1 capsule (100 mg total) by mouth at bedtime.  Dispense: 21 capsule; Refill: 0 -     Meloxicam; Take 1 tablet (15 mg total) by mouth daily as needed for pain.  Dispense: 30 tablet; Refill: 0         Orders & Medications     Meds ordered this encounter  Medications   predniSONE (DELTASONE) 50 MG tablet      Sig: Take 1 tablet (50 mg total) by mouth daily.      Dispense:  5 tablet      Refill:  0   gabapentin (NEURONTIN) 100 MG capsule      Sig: Take 1 capsule (100 mg total) by mouth at bedtime.      Dispense:  21 capsule      Refill:  0   meloxicam (MOBIC) 15 MG tablet      Sig: Take 1 tablet (15 mg total) by mouth daily as needed for pain.      Dispense:  30 tablet      Refill:  0       Orders Placed This Encounter  Procedures   MR Lumbar Spine Wo Contrast      No follow-ups on file.      Jerrol Banana, MD, CAQSM    Primary Care Sports Medicine Primary Care and Sports Medicine at [facility]         Assessment & Plan Note by Jerrol Banana, MD at 12/30/2022 11:54 AM  Author: Jerrol Banana, MD Author Type: Physician Filed: 12/30/2022 11:54 AM  Note Status: Written Cosign: Cosign Not Required Encounter Date: 12/30/2022  Problem: Lumbosacral radiculopathy  Editor: Jerrol Banana, MD (Physician)  2 year history of atraumatic low back pain, ties onset to birth of child and associated physical demands. Has been performing PT for months with initial improvement until last week when he began to note progressive worsening. Did see outside orthopedic group who ordered x-rays, started Medrol Pak, Flexeril, and he has noted mild improvement, still with left sided thigh and intermittent left foot paresthesias.   Examination with pain during lumbar flexion, +SLR left, +iliopsoas testing, L > R, paraspinal spasm, sensorimotor intact,  provocative testing otherwise benign.   Given reassuring x-rays, clinical history and current findings despite treatments, plan as follows:   - Stop prior Medrol Dosepak - Start prednisone 50 mg daily x 3-5 days (stop when symptoms improve) - If symptoms persist past prednisone, dose meloxicam daily  - Start nightly gabapentin - Can continue cyclobenzaprine (muscle relaxer) as-needed - Gentle activity as tolerated       PAIN:  Are you having pain? Yes: NPRS scale: 6/10 Pain location: L low back Pain description: deep/radiating Aggravating factors: increase activity Relieving factors: meds/ rest  PRECAUTIONS: None  WEIGHT BEARING RESTRICTIONS: No  FALLS:  Has patient fallen in last 6 months? Yes. Number of falls 1 (yesterday on outside stairs/ landed directly on back)- no bruising noted.   LIVING ENVIRONMENT: Lives with: lives with their family Lives in: House/apartment Stairs: Yes: External: 3 steps; on right going up Has following equipment at home: None  OCCUPATION: Works remotely from home  PLOF: Independent  PATIENT GOALS: Decrease back pain/ complete everyday tasks with no pain  NEXT MD VISIT: PRN  OBJECTIVE:   DIAGNOSTIC FINDINGS:  EXAM: MRI LUMBAR SPINE WITHOUT CONTRAST   TECHNIQUE: Multiplanar, multisequence MR imaging of the lumbar spine was performed. No intravenous contrast was administered.   COMPARISON:  None Available.   FINDINGS: Segmentation: Standard. Lowest well-formed disc space labeled the L5-S1 level.   Alignment: Physiologic with preservation of the normal lumbar lordosis. No listhesis.   Vertebrae: Vertebral body height maintained without acute or chronic fracture. Bone marrow signal intensity within normal limits. No discrete or worrisome osseous lesions. No abnormal marrow edema.   Conus medullaris and cauda equina: Conus extends to the T12 level. Conus and cauda equina appear normal.   Paraspinal and other soft tissues:  Unremarkable.   Disc levels:   L1-2:  Unremarkable.   L2-3:  Unremarkable.   L3-4: Disc desiccation with mild disc bulge. No spinal stenosis. Foramina remain patent.   L4-5: Disc desiccation. Superimposed central disc protrusion indents the ventral thecal sac (series 5, image 28). Protruding disc closely approximates the descending L5 nerve roots without frank impingement or displacement. Resultant mild narrowing of the lateral recesses bilaterally. Central canal remains patent. No significant foraminal stenosis.   L5-S1:  Unremarkable.   IMPRESSION: 1. Central disc protrusion at L4-5, closely approximating and potentially irritating either of the descending L5 nerve roots. No frank neural impingement. 2. Mild disc bulge at L3-4 without stenosis or impingement.     Electronically Signed   By: Rise Mu M.D.   On: 01/09/2023 00:17  PATIENT SURVEYS:  FOTO initial 49/ goal 63  SCREENING FOR RED FLAGS: Bowel or bladder incontinence: No Spinal tumors: No Cauda equina syndrome: No Compression fracture: No Abdominal aneurysm: No  COGNITION: Overall cognitive status: Within functional limits for tasks assessed     SENSATION: WFL  MUSCLE LENGTH: Hamstrings: Right 54 deg; Left 62 deg (pain) Thomas test: NT  POSTURE: rounded shoulders.  Seated with R lateral lean  PALPATION: (+)  L lumbar/ SI tenderness.  Prone low thoracic/lumbar spine hypomobility (generalized).  No reproduction of radicular symptoms.    LUMBAR ROM:   AROM eval  Flexion 50% limited  Extension 25% limited  Right lateral flexion WFL  Left lateral flexion WFL  Right rotation [facility] ("feels good")  Left rotation WFL    (Blank rows = not tested)  LOWER EXTREMITY ROM:     Active  Right eval Left eval  Hip flexion [facility]  Hip extension [facility]  Hip abduction [facility]  Hip adduction    Hip internal rotation [facility] WFL  Hip external rotation [facility]  Knee flexion [facility] WFL  Knee  extension [facility] WFL  Ankle dorsiflexion    Ankle plantarflexion    Ankle inversion    Ankle eversion     (Blank rows = not tested)  LOWER EXTREMITY MMT:    MMT Right eval Left eval  Hip flexion 4/5 4/5  Hip extension    Hip abduction 4/5 4/5  Hip adduction    Hip internal rotation    Hip external rotation    Knee flexion 5/5 5/5  Knee extension 5/5 5/5  Ankle dorsiflexion    Ankle plantarflexion    Ankle inversion    Ankle eversion     (Blank rows = not tested)  LUMBAR SPECIAL TESTS:  Straight leg raise test: Positive, SI Compression/distraction test: Negative, and FABER test: Negative  FUNCTIONAL TESTS:  5 times sit to stand: TBD  GAIT: Distance walked: in clinic Assistive device utilized: None Level of assistance: Complete Independence Comments: slight L antalgic gait with increase steps after standing.    TODAY'S TREATMENT:                                                                                                                              DATE: 02/01/2023  Subjective:  Pt. Reports 6/10 low back pain this morning.  Pt. States he always hurts in the morning.  Pt. Reports feeling better over this past weekend after initial PT evaluation.  Pt. Discussed fall last week and states he landed more on sacrum/tailbone than low back (no bruising noted in low back).   Manual tx.:  MH to low back in prone position (varying placements) during mobs./ STM.  Pt. Reports MH "feels good"  Prone grade II-III PA mobs. To low thoracic/lumbar 20 sec. X 2 (unilateral/central)- focus on T12-L4).  STM to low back with use of Hypervolt prior to supine stretches/ lumbar ROM reassessment).  No pain reported.      Supine LE/lumbar stretches (generalized)- hamstring/ piriformis/ rotn./ FABER.  Good hip ROM/ no pain with supine lumbar rotn. (WNL).    There.ex.:  Reissued HEP (pt. Lost initial copy).  Good technique and understanding.  Ice to low back in supine after tx.  Discussed POC/  sleeping position (sidesleeper/ mattress).     PATIENT EDUCATION:  Education details: Access Code: [email or web address]/ discussed previous PT at  [facility] Psychologist, educational Person educated: Patient Education method: Explanation, Demonstration, and Handouts Education comprehension: verbalized understanding and returned demonstration  HOME EXERCISE PROGRAM: Access Code: [email or web address] URL: [email or web address] Date: 01/27/2023 Prepared by: Dorene Grebe  Exercises - Prone Press Up  - 2 x daily - 7 x weekly - 1 sets - 5 reps - 10 seconds hold - Supine Hamstring Stretch with Strap  - 2 x daily - 7 x weekly - 1 sets - 4 reps - 30 seconds hold - Supine Figure 4 Piriformis Stretch  - 2 x daily - 7 x weekly - 1 sets - 4 reps - 30 seconds hold - Supine Lower Trunk Rotation  - 2 x daily - 7 x weekly - 1 sets - 10 reps - Standing Lumbar Extension  - 2 x daily - 7 x weekly - 1 sets - 5 reps  ASSESSMENT:  CLINICAL IMPRESSION: Pt. reports 6/10 low back pain at rest and episodes of no pain during manual tx./ position changes.  Pt. limited in B hamstring length/ neural glides with increase reps.  Pt. Understands current HEP/ benefits of ice and standing desk at home.  Pt. Will benefit from skilled PT services to increase lumbar ROM/ hip and core muscle strength to improve pain-free mobility.    OBJECTIVE IMPAIRMENTS: Abnormal gait, decreased activity tolerance, decreased mobility, difficulty walking, decreased ROM, decreased strength, hypomobility, impaired flexibility, improper body mechanics, postural dysfunction, and pain.   ACTIVITY LIMITATIONS: carrying, lifting, bending, sitting, standing, and locomotion level  PARTICIPATION LIMITATIONS: community activity, occupation, and yard work  PERSONAL FACTORS: Past/current experiences are also affecting patient's functional outcome.   REHAB POTENTIAL: Good  CLINICAL DECISION MAKING: Evolving/moderate complexity  EVALUATION COMPLEXITY: Moderate   GOALS: Goals  reviewed with patient? Yes  SHORT TERM GOALS: Target date: 02/17/23  Pt. Independent with HEP to increase core/ hip strength 1/2 muscle grade to improve pain-free mobility.   Baseline:  See above Goal status: INITIAL   LONG TERM GOALS: Target date: 03/10/23  Pt. Will increase FOTO to 63 to improve pain-free mobility.   Baseline: initial 49 Goal status: INITIAL  2.  Pt. Will reports no L LE/lateral thigh radicular symptoms with daily tasks consistently for a week to improve pain-free mobility.   Baseline: L lateral thigh radicular symptoms.  Goal status: INITIAL  3.  Pt. Will report <3/10 low back pain at worst with household/ work-related tasks.   Baseline: 6/10 pain at rest and >8/10 at worst.  Goal status: INITIAL   PLAN:  PT FREQUENCY: 2x/week  PT DURATION: 6 weeks  PLANNED INTERVENTIONS: Therapeutic exercises, Therapeutic activity, Neuromuscular re-education, Balance training, Gait training, Patient/Family education, Self Care, Joint mobilization, Dry Needling, Electrical stimulation, Spinal mobilization, Cryotherapy, Moist heat, Manual therapy, and Re-evaluation.  PLAN FOR NEXT SESSION: Reassess hamstring length/ neural glides/ Issue core ex.  Cammie Mcgee, PT, DPT # [phone] 02/01/2023, 8:18 PM

## 2023-02-04 ENCOUNTER — Ambulatory Visit: Payer: 59

## 2023-02-04 ENCOUNTER — Encounter: Payer: Self-pay | Admitting: Physical Therapy

## 2023-02-04 DIAGNOSIS — M5459 Other low back pain: Secondary | ICD-10-CM

## 2023-02-04 DIAGNOSIS — M2560 Stiffness of unspecified joint, not elsewhere classified: Secondary | ICD-10-CM

## 2023-02-04 DIAGNOSIS — M5417 Radiculopathy, lumbosacral region: Secondary | ICD-10-CM | POA: Diagnosis not present

## 2023-02-04 DIAGNOSIS — M6281 Muscle weakness (generalized): Secondary | ICD-10-CM

## 2023-02-04 NOTE — Therapy (Signed)
OUTPATIENT PHYSICAL THERAPY THORACOLUMBAR TREATMENT   Patient Name: Tyler Adams MRN: [id number] DOB:1991-04-14, 32 y.o., male Today's Date: 02/04/2023  END OF SESSION:  PT End of Session - 02/04/23 1119     Visit Number 3    Number of Visits 13    Date for PT Re-Evaluation 03/10/23    PT Start Time 1116    PT Stop Time 1200    PT Time Calculation (min) 44 min    Activity Tolerance Patient tolerated treatment well    Behavior During Therapy [facility] for tasks assessed/performed             History reviewed. No pertinent past medical history. Past Surgical History:  Procedure Laterality Date   SHOULDER SURGERY     WISDOM TOOTH EXTRACTION     Patient Active Problem List   Diagnosis Date Noted   Lumbosacral radiculopathy 12/30/2022   PCP: Bernerd Limbo, MD  REFERRING PROVIDER: Jerrol Banana, MD  REFERRING DIAG: Lumbosacral radiculopathy due to intervertebral disc disorder  Rationale for Evaluation and Treatment: Rehabilitation  THERAPY DIAG:  Lumbosacral radiculopathy  Other low back pain  Joint stiffness of spine  Muscle weakness (generalized)  ONSET DATE: >2 years (chronic)- started around birth of child  SUBJECTIVE:                                                                                                                                                                                           SUBJECTIVE STATEMENT: Pt. Discussed chronicity of low back symptoms over past 2 years. Pt. Went to PT at [facility] (03/2022) and then [facility] (07/2022).  Pt. States he "tweaked" back 6 weeks ago and fell on back yesterday after slipping on outside stairs.  Pt. Has a  standing desk at home for work.  Pt. Has used MH in past, not ice.  Pt. Reports 6/10 low back pain currently at rest, 3/10 pain at best.  Pt. Sits with moderate R lateral lean in chair.  Pt. States Prednisone has helped in past.    PERTINENT HISTORY:  Expand All Collapse All       Primary Care /  Sports Medicine Office Visit   Patient Information:  Patient ID: Tyler Adams, male DOB: [birth year] Age: 32 y.o. MRN: [id number]    Tyler Adams is a pleasant 32 y.o. male presenting with the following:       Chief Complaint  Patient presents with   Back Pain      Couple months, went to PT, woke up Thursday in severe pain unable to move.          Vitals:  12/30/22 1119  BP: 120/80  Pulse: 78  SpO2: 98%       Vitals:    12/30/22 1119  Weight: 253 lb (114.8 kg)  Height: 5\' 8"  (1.727 m)    Body mass index is 38.47 kg/m.   Imaging Results  No results found.      Independent interpretation of notes and tests performed by another provider:    Independent interpretation of 2 view lumbar spine from [facility] reveals maintained alignment, no intervertebral space loss, no facet hypertrophy, no listhesis, normal x-rays.   Procedures performed:    None   Pertinent History, Exam, Impression, and Recommendations:    [name] was seen today for back pain.   Lumbosacral radiculopathy Assessment & Plan: 2 year history of atraumatic low back pain, ties onset to birth of child and associated physical demands. Has been performing PT for months with initial improvement until last week when he began to note progressive worsening. Did see outside orthopedic group who ordered x-rays, started Medrol Pak, Flexeril, and he has noted mild improvement, still with left sided thigh and intermittent left foot paresthesias.   Examination with pain during lumbar flexion, +SLR left, +iliopsoas testing, L > R, paraspinal spasm, sensorimotor intact, provocative testing otherwise benign.   Given reassuring x-rays, clinical history and current findings despite treatments, plan as follows:   - Stop prior Medrol Dosepak - Start prednisone 50 mg daily x 3-5 days (stop when symptoms improve) - If symptoms persist past prednisone, dose meloxicam daily  - Start nightly gabapentin - Can continue  cyclobenzaprine (muscle relaxer) as-needed - Gentle activity as tolerated   Orders: -     MR LUMBAR SPINE WO CONTRAST; Future -     predniSONE; Take 1 tablet (50 mg total) by mouth daily.  Dispense: 5 tablet; Refill: 0 -     Gabapentin; Take 1 capsule (100 mg total) by mouth at bedtime.  Dispense: 21 capsule; Refill: 0 -     Meloxicam; Take 1 tablet (15 mg total) by mouth daily as needed for pain.  Dispense: 30 tablet; Refill: 0         Orders & Medications     Meds ordered this encounter  Medications   predniSONE (DELTASONE) 50 MG tablet      Sig: Take 1 tablet (50 mg total) by mouth daily.      Dispense:  5 tablet      Refill:  0   gabapentin (NEURONTIN) 100 MG capsule      Sig: Take 1 capsule (100 mg total) by mouth at bedtime.      Dispense:  21 capsule      Refill:  0   meloxicam (MOBIC) 15 MG tablet      Sig: Take 1 tablet (15 mg total) by mouth daily as needed for pain.      Dispense:  30 tablet      Refill:  0       Orders Placed This Encounter  Procedures   MR Lumbar Spine Wo Contrast      No follow-ups on file.      Jerrol Banana, MD, CAQSM    Primary Care Sports Medicine Primary Care and Sports Medicine at [facility]         Assessment & Plan Note by Jerrol Banana, MD at 12/30/2022 11:54 AM  Author: Jerrol Banana, MD Author Type: Physician Filed: 12/30/2022 11:54 AM  Note Status: Written Cosign: Cosign Not Required Encounter Date: 12/30/2022  Problem: Lumbosacral radiculopathy  Editor: Jerrol Banana, MD (Physician)             2 year history of atraumatic low back pain, ties onset to birth of child and associated physical demands. Has been performing PT for months with initial improvement until last week when he began to note progressive worsening. Did see outside orthopedic group who ordered x-rays, started Medrol Pak, Flexeril, and he has noted mild improvement, still with left sided thigh and intermittent left foot paresthesias.    Examination with pain during lumbar flexion, +SLR left, +iliopsoas testing, L > R, paraspinal spasm, sensorimotor intact, provocative testing otherwise benign.   Given reassuring x-rays, clinical history and current findings despite treatments, plan as follows:   - Stop prior Medrol Dosepak - Start prednisone 50 mg daily x 3-5 days (stop when symptoms improve) - If symptoms persist past prednisone, dose meloxicam daily  - Start nightly gabapentin - Can continue cyclobenzaprine (muscle relaxer) as-needed - Gentle activity as tolerated       PAIN:  Are you having pain? Yes: NPRS scale: 6/10 Pain location: L low back Pain description: deep/radiating Aggravating factors: increase activity Relieving factors: meds/ rest  PRECAUTIONS: None  WEIGHT BEARING RESTRICTIONS: No  FALLS:  Has patient fallen in last 6 months? Yes. Number of falls 1 (yesterday on outside stairs/ landed directly on back)- no bruising noted.   LIVING ENVIRONMENT: Lives with: lives with their family Lives in: House/apartment Stairs: Yes: External: 3 steps; on right going up Has following equipment at home: None  OCCUPATION: Works remotely from home  PLOF: Independent  PATIENT GOALS: Decrease back pain/ complete everyday tasks with no pain  NEXT MD VISIT: PRN  OBJECTIVE:   DIAGNOSTIC FINDINGS:  EXAM: MRI LUMBAR SPINE WITHOUT CONTRAST   TECHNIQUE: Multiplanar, multisequence MR imaging of the lumbar spine was performed. No intravenous contrast was administered.   COMPARISON:  None Available.   FINDINGS: Segmentation: Standard. Lowest well-formed disc space labeled the L5-S1 level.   Alignment: Physiologic with preservation of the normal lumbar lordosis. No listhesis.   Vertebrae: Vertebral body height maintained without acute or chronic fracture. Bone marrow signal intensity within normal limits. No discrete or worrisome osseous lesions. No abnormal marrow edema.   Conus medullaris and  cauda equina: Conus extends to the T12 level. Conus and cauda equina appear normal.   Paraspinal and other soft tissues: Unremarkable.   Disc levels:   L1-2:  Unremarkable.   L2-3:  Unremarkable.   L3-4: Disc desiccation with mild disc bulge. No spinal stenosis. Foramina remain patent.   L4-5: Disc desiccation. Superimposed central disc protrusion indents the ventral thecal sac (series 5, image 28). Protruding disc closely approximates the descending L5 nerve roots without frank impingement or displacement. Resultant mild narrowing of the lateral recesses bilaterally. Central canal remains patent. No significant foraminal stenosis.   L5-S1:  Unremarkable.   IMPRESSION: 1. Central disc protrusion at L4-5, closely approximating and potentially irritating either of the descending L5 nerve roots. No frank neural impingement. 2. Mild disc bulge at L3-4 without stenosis or impingement.     Electronically Signed   By: Rise Mu M.D.   On: 01/09/2023 00:17  PATIENT SURVEYS:  FOTO initial 49/ goal 63  SCREENING FOR RED FLAGS: Bowel or bladder incontinence: No Spinal tumors: No Cauda equina syndrome: No Compression fracture: No Abdominal aneurysm: No  COGNITION: Overall cognitive status: Within functional limits for tasks assessed     SENSATION: WFL  MUSCLE LENGTH: Hamstrings:  Right 54 deg; Left 62 deg (pain) Thomas test: NT  POSTURE: rounded shoulders.  Seated with R lateral lean  PALPATION: (+) L lumbar/ SI tenderness.  Prone low thoracic/lumbar spine hypomobility (generalized).  No reproduction of radicular symptoms.    LUMBAR ROM:   AROM eval  Flexion 50% limited  Extension 25% limited  Right lateral flexion WFL  Left lateral flexion WFL  Right rotation [facility] ("feels good")  Left rotation WFL    (Blank rows = not tested)  LOWER EXTREMITY ROM:     Active  Right eval Left eval  Hip flexion [facility]  Hip extension [facility]  Hip abduction [facility]  [facility]  Hip adduction    Hip internal rotation [facility] WFL  Hip external rotation [facility]  Knee flexion [facility] WFL  Knee extension [facility] WFL  Ankle dorsiflexion    Ankle plantarflexion    Ankle inversion    Ankle eversion     (Blank rows = not tested)  LOWER EXTREMITY MMT:    MMT Right eval Left eval  Hip flexion 4/5 4/5  Hip extension    Hip abduction 4/5 4/5  Hip adduction    Hip internal rotation    Hip external rotation    Knee flexion 5/5 5/5  Knee extension 5/5 5/5  Ankle dorsiflexion    Ankle plantarflexion    Ankle inversion    Ankle eversion     (Blank rows = not tested)  LUMBAR SPECIAL TESTS:  Straight leg raise test: Positive, SI Compression/distraction test: Negative, and FABER test: Negative  FUNCTIONAL TESTS:  5 times sit to stand: TBD  GAIT: Distance walked: in clinic Assistive device utilized: None Level of assistance: Complete Independence Comments: slight L antalgic gait with increase steps after standing.    TODAY'S TREATMENT:                                                                                                                              DATE: 02/04/2023  Subjective: Pt reports 5/10 pain. Reports HEP and standing desk is helping his symptoms.    Manual tx.:  MH to low back in prone position (varying placements) during joint mobs.  Prone grade II-III PA mobs. To low thoracic/lumbar 20 sec. X 2 (unilateral R and L/central)- focus on T8-L5) for pain modulation and joint mobility  Supine LE/lumbar stretches (generalized)- hamstring/ piriformis/ FABER: 3x30 sec/stretch    There.exBurnetta Sabin lying LTR's: x15/side Posterior pelvic tilts: 2x10  TrA activation with marches: 2x10   TrA activation with SLR: 2x10   Super man alternating UE and contralateral LE's: 3x6    PATIENT EDUCATION:  Education details: Access Code: [email or web address]/ discussed previous PT at [facility] and [employer] Person educated: Patient Education method: Explanation, Demonstration, and  Handouts Education comprehension: verbalized understanding and returned demonstration  HOME EXERCISE PROGRAM: Access Code: [email or web address] URL: [email or web address] Date: 01/27/2023 Prepared by: Dorene Grebe  Exercises - Prone Press Up  - 2 x  daily - 7 x weekly - 1 sets - 5 reps - 10 seconds hold - Supine Hamstring Stretch with Strap  - 2 x daily - 7 x weekly - 1 sets - 4 reps - 30 seconds hold - Supine Figure 4 Piriformis Stretch  - 2 x daily - 7 x weekly - 1 sets - 4 reps - 30 seconds hold - Supine Lower Trunk Rotation  - 2 x daily - 7 x weekly - 1 sets - 10 reps - Standing Lumbar Extension  - 2 x daily - 7 x weekly - 1 sets - 5 reps  ASSESSMENT:  CLINICAL IMPRESSION: Continuing PT POC with focus on manual techniques and progressing to core strengthening. Pt remains significantly limited in L hamstring tightness and mild pain with manual intervention/mobility work. Pt demonstrates excellent understanding of TrA activation exercises displaying ability to maintain core stability with UE/LE engagement without exacerbation of pain. Encouraged pt to f/u with primary PT on response to progression and incorporate in HEP if pt responds positively. Pt understanding. Pt will benefit from skilled PT services to increase lumbar ROM/ hip and core muscle strength to improve pain-free mobility.   OBJECTIVE IMPAIRMENTS: Abnormal gait, decreased activity tolerance, decreased mobility, difficulty walking, decreased ROM, decreased strength, hypomobility, impaired flexibility, improper body mechanics, postural dysfunction, and pain.   ACTIVITY LIMITATIONS: carrying, lifting, bending, sitting, standing, and locomotion level  PARTICIPATION LIMITATIONS: community activity, occupation, and yard work  PERSONAL FACTORS: Past/current experiences are also affecting patient's functional outcome.   REHAB POTENTIAL: Good  CLINICAL DECISION MAKING: Evolving/moderate complexity  EVALUATION COMPLEXITY:  Moderate   GOALS: Goals reviewed with patient? Yes  SHORT TERM GOALS: Target date: 02/17/23  Pt. Independent with HEP to increase core/ hip strength 1/2 muscle grade to improve pain-free mobility.   Baseline:  See above Goal status: INITIAL   LONG TERM GOALS: Target date: 03/10/23  Pt. Will increase FOTO to 63 to improve pain-free mobility.   Baseline: initial 49 Goal status: INITIAL  2.  Pt. Will reports no L LE/lateral thigh radicular symptoms with daily tasks consistently for a week to improve pain-free mobility.   Baseline: L lateral thigh radicular symptoms.  Goal status: INITIAL  3.  Pt. Will report <3/10 low back pain at worst with household/ work-related tasks.   Baseline: 6/10 pain at rest and >8/10 at worst.  Goal status: INITIAL   PLAN:  PT FREQUENCY: 2x/week  PT DURATION: 6 weeks  PLANNED INTERVENTIONS: Therapeutic exercises, Therapeutic activity, Neuromuscular re-education, Balance training, Gait training, Patient/Family education, Self Care, Joint mobilization, Dry Needling, Electrical stimulation, Spinal mobilization, Cryotherapy, Moist heat, Manual therapy, and Re-evaluation.  PLAN FOR NEXT SESSION: Reassess tolerance for core exercises. Provide in HEP if tolerated well.   Delphia Grates. Fairly IV, PT, DPT Physical Therapist- [place]  [facility]  02/04/2023, 12:04 PM

## 2023-02-23 ENCOUNTER — Encounter: Payer: Self-pay | Admitting: Physical Therapy

## 2023-02-23 ENCOUNTER — Ambulatory Visit: Payer: 59 | Attending: Family Medicine | Admitting: Physical Therapy

## 2023-02-23 DIAGNOSIS — M2560 Stiffness of unspecified joint, not elsewhere classified: Secondary | ICD-10-CM | POA: Diagnosis present

## 2023-02-23 DIAGNOSIS — M6281 Muscle weakness (generalized): Secondary | ICD-10-CM | POA: Insufficient documentation

## 2023-02-23 DIAGNOSIS — M5417 Radiculopathy, lumbosacral region: Secondary | ICD-10-CM | POA: Diagnosis present

## 2023-02-23 DIAGNOSIS — M5459 Other low back pain: Secondary | ICD-10-CM | POA: Insufficient documentation

## 2023-02-23 NOTE — Therapy (Signed)
OUTPATIENT PHYSICAL THERAPY THORACOLUMBAR TREATMENT  Patient Name: Tyler Adams MRN: [id number] DOB:[birth year], 32 y.o., male Today's Date: 02/24/2023  END OF SESSION:  PT End of Session - 02/23/23 1513     Visit Number 4    Number of Visits 13    Date for PT Re-Evaluation 03/10/23    PT Start Time 1513    PT Stop Time 1600    PT Time Calculation (min) 47 min    Activity Tolerance Patient tolerated treatment well    Behavior During Therapy [facility] for tasks assessed/performed             History reviewed. No pertinent past medical history. Past Surgical History:  Procedure Laterality Date   SHOULDER SURGERY     WISDOM TOOTH EXTRACTION     Patient Active Problem List   Diagnosis Date Noted   Lumbosacral radiculopathy 12/30/2022   PCP: Bernerd Limbo, MD  REFERRING PROVIDER: Jerrol Banana, MD  REFERRING DIAG: Lumbosacral radiculopathy due to intervertebral disc disorder  Rationale for Evaluation and Treatment: Rehabilitation  THERAPY DIAG:  Lumbosacral radiculopathy  Other low back pain  Joint stiffness of spine  Muscle weakness (generalized)  ONSET DATE: >2 years (chronic)- started around birth of child  SUBJECTIVE:                                                                                                                                                                                           SUBJECTIVE STATEMENT: Pt. Discussed chronicity of low back symptoms over past 2 years. Pt. Went to PT at [facility] (03/2022) and then [facility] (07/2022).  Pt. States he "tweaked" back 6 weeks ago and fell on back yesterday after slipping on outside stairs.  Pt. Has a  standing desk at home for work.  Pt. Has used MH in past, not ice.  Pt. Reports 6/10 low back pain currently at rest, 3/10 pain at best.  Pt. Sits with moderate R lateral lean in chair.  Pt. States Prednisone has helped in past.    PERTINENT HISTORY:  Expand All Collapse All       Primary Care /  Sports Medicine Office Visit   Patient Information:  Patient ID: Tyler Adams, male DOB: 12/12/1990 Age: 32 y.o. MRN: [id number]    Domani Bakos is a pleasant 32 y.o. male presenting with the following:       Chief Complaint  Patient presents with   Back Pain      Couple months, went to PT, woke up Thursday in severe pain unable to move.          Vitals:  12/30/22 1119  BP: 120/80  Pulse: 78  SpO2: 98%       Vitals:    12/30/22 1119  Weight: 253 lb (114.8 kg)  Height: 5\' 8"  (1.727 m)    Body mass index is 38.47 kg/m.   Imaging Results  No results found.      Independent interpretation of notes and tests performed by another provider:    Independent interpretation of 2 view lumbar spine from [facility] reveals maintained alignment, no intervertebral space loss, no facet hypertrophy, no listhesis, normal x-rays.   Procedures performed:    None   Pertinent History, Exam, Impression, and Recommendations:    [name] was seen today for back pain.   Lumbosacral radiculopathy Assessment & Plan: 2 year history of atraumatic low back pain, ties onset to birth of child and associated physical demands. Has been performing PT for months with initial improvement until last week when he began to note progressive worsening. Did see outside orthopedic group who ordered x-rays, started Medrol Pak, Flexeril, and he has noted mild improvement, still with left sided thigh and intermittent left foot paresthesias.   Examination with pain during lumbar flexion, +SLR left, +iliopsoas testing, L > R, paraspinal spasm, sensorimotor intact, provocative testing otherwise benign.   Given reassuring x-rays, clinical history and current findings despite treatments, plan as follows:   - Stop prior Medrol Dosepak - Start prednisone 50 mg daily x 3-5 days (stop when symptoms improve) - If symptoms persist past prednisone, dose meloxicam daily  - Start nightly gabapentin - Can continue  cyclobenzaprine (muscle relaxer) as-needed - Gentle activity as tolerated   Orders: -     MR LUMBAR SPINE WO CONTRAST; Future -     predniSONE; Take 1 tablet (50 mg total) by mouth daily.  Dispense: 5 tablet; Refill: 0 -     Gabapentin; Take 1 capsule (100 mg total) by mouth at bedtime.  Dispense: 21 capsule; Refill: 0 -     Meloxicam; Take 1 tablet (15 mg total) by mouth daily as needed for pain.  Dispense: 30 tablet; Refill: 0         Orders & Medications     Meds ordered this encounter  Medications   predniSONE (DELTASONE) 50 MG tablet      Sig: Take 1 tablet (50 mg total) by mouth daily.      Dispense:  5 tablet      Refill:  0   gabapentin (NEURONTIN) 100 MG capsule      Sig: Take 1 capsule (100 mg total) by mouth at bedtime.      Dispense:  21 capsule      Refill:  0   meloxicam (MOBIC) 15 MG tablet      Sig: Take 1 tablet (15 mg total) by mouth daily as needed for pain.      Dispense:  30 tablet      Refill:  0       Orders Placed This Encounter  Procedures   MR Lumbar Spine Wo Contrast      No follow-ups on file.      Jerrol Banana, MD, CAQSM    Primary Care Sports Medicine Primary Care and Sports Medicine at [facility]         Assessment & Plan Note by Jerrol Banana, MD at 12/30/2022 11:54 AM  Author: Jerrol Banana, MD Author Type: Physician Filed: 12/30/2022 11:54 AM  Note Status: Written Cosign: Cosign Not Required Encounter Date: 12/30/2022  Problem: Lumbosacral radiculopathy  Editor: Jerrol Banana, MD (Physician)             2 year history of atraumatic low back pain, ties onset to birth of child and associated physical demands. Has been performing PT for months with initial improvement until last week when he began to note progressive worsening. Did see outside orthopedic group who ordered x-rays, started Medrol Pak, Flexeril, and he has noted mild improvement, still with left sided thigh and intermittent left foot paresthesias.    Examination with pain during lumbar flexion, +SLR left, +iliopsoas testing, L > R, paraspinal spasm, sensorimotor intact, provocative testing otherwise benign.   Given reassuring x-rays, clinical history and current findings despite treatments, plan as follows:   - Stop prior Medrol Dosepak - Start prednisone 50 mg daily x 3-5 days (stop when symptoms improve) - If symptoms persist past prednisone, dose meloxicam daily  - Start nightly gabapentin - Can continue cyclobenzaprine (muscle relaxer) as-needed - Gentle activity as tolerated       PAIN:  Are you having pain? Yes: NPRS scale: 6/10 Pain location: L low back Pain description: deep/radiating Aggravating factors: increase activity Relieving factors: meds/ rest  PRECAUTIONS: None  WEIGHT BEARING RESTRICTIONS: No  FALLS:  Has patient fallen in last 6 months? Yes. Number of falls 1 (yesterday on outside stairs/ landed directly on back)- no bruising noted.   LIVING ENVIRONMENT: Lives with: lives with their family Lives in: House/apartment Stairs: Yes: External: 3 steps; on right going up Has following equipment at home: None  OCCUPATION: Works remotely from home  PLOF: Independent  PATIENT GOALS: Decrease back pain/ complete everyday tasks with no pain  NEXT MD VISIT: PRN  OBJECTIVE:   DIAGNOSTIC FINDINGS:  EXAM: MRI LUMBAR SPINE WITHOUT CONTRAST   TECHNIQUE: Multiplanar, multisequence MR imaging of the lumbar spine was performed. No intravenous contrast was administered.   COMPARISON:  None Available.   FINDINGS: Segmentation: Standard. Lowest well-formed disc space labeled the L5-S1 level.   Alignment: Physiologic with preservation of the normal lumbar lordosis. No listhesis.   Vertebrae: Vertebral body height maintained without acute or chronic fracture. Bone marrow signal intensity within normal limits. No discrete or worrisome osseous lesions. No abnormal marrow edema.   Conus medullaris and  cauda equina: Conus extends to the T12 level. Conus and cauda equina appear normal.   Paraspinal and other soft tissues: Unremarkable.   Disc levels:   L1-2:  Unremarkable.   L2-3:  Unremarkable.   L3-4: Disc desiccation with mild disc bulge. No spinal stenosis. Foramina remain patent.   L4-5: Disc desiccation. Superimposed central disc protrusion indents the ventral thecal sac (series 5, image 28). Protruding disc closely approximates the descending L5 nerve roots without frank impingement or displacement. Resultant mild narrowing of the lateral recesses bilaterally. Central canal remains patent. No significant foraminal stenosis.   L5-S1:  Unremarkable.   IMPRESSION: 1. Central disc protrusion at L4-5, closely approximating and potentially irritating either of the descending L5 nerve roots. No frank neural impingement. 2. Mild disc bulge at L3-4 without stenosis or impingement.     Electronically Signed   By: Rise Mu M.D.   On: 01/09/2023 00:17  PATIENT SURVEYS:  FOTO initial 49/ goal 63  SCREENING FOR RED FLAGS: Bowel or bladder incontinence: No Spinal tumors: No Cauda equina syndrome: No Compression fracture: No Abdominal aneurysm: No  COGNITION: Overall cognitive status: Within functional limits for tasks assessed     SENSATION: WFL  MUSCLE LENGTH: Hamstrings:  Right 54 deg; Left 62 deg (pain) Thomas test: NT  POSTURE: rounded shoulders.  Seated with R lateral lean  PALPATION: (+) L lumbar/ SI tenderness.  Prone low thoracic/lumbar spine hypomobility (generalized).  No reproduction of radicular symptoms.    LUMBAR ROM:   AROM eval  Flexion 50% limited  Extension 25% limited  Right lateral flexion WFL  Left lateral flexion WFL  Right rotation [facility] ("feels good")  Left rotation WFL    (Blank rows = not tested)  LOWER EXTREMITY ROM:     Active  Right eval Left eval  Hip flexion [facility]  Hip extension [facility]  Hip abduction [facility]  [facility]  Hip adduction    Hip internal rotation [facility] WFL  Hip external rotation [facility]  Knee flexion [facility] WFL  Knee extension [facility] WFL  Ankle dorsiflexion    Ankle plantarflexion    Ankle inversion    Ankle eversion     (Blank rows = not tested)  LOWER EXTREMITY MMT:    MMT Right eval Left eval  Hip flexion 4/5 4/5  Hip extension    Hip abduction 4/5 4/5  Hip adduction    Hip internal rotation    Hip external rotation    Knee flexion 5/5 5/5  Knee extension 5/5 5/5  Ankle dorsiflexion    Ankle plantarflexion    Ankle inversion    Ankle eversion     (Blank rows = not tested)  LUMBAR SPECIAL TESTS:  Straight leg raise test: Positive, SI Compression/distraction test: Negative, and FABER test: Negative  FUNCTIONAL TESTS:  5 times sit to stand: TBD  GAIT: Distance walked: in clinic Assistive device utilized: None Level of assistance: Complete Independence Comments: slight L antalgic gait with increase steps after standing.    TODAY'S TREATMENT:                                                                                                                              DATE: 02/24/2023  Subjective: Pt reports 3/10 pain.  Pt. Reports he has been doing well over past couple weeks.  Pt. Aware of body mechanics with lifting dtr.    Manual tx.:  Supine LE/lumbar stretches (generalized)- hamstring/ piriformis/ FABER: 3x30 sec/stretch  MH to low back in prone position (varying placements) during joint mobs.  Prone grade II-III PA mobs. To low thoracic/lumbar 20 sec. X 2 (unilateral R and L/central)- focus on T8-L5) for pain modulation and joint mobility  There.exBurnetta Sabin lying LTR's: x15/side Posterior pelvic tilts: 2x10  TrA activation with marches: 2x10   TrA activation with SLR: 2x10    Reviewed HEP (no changes).     PATIENT EDUCATION:  Education details: Access Code: [email or web address] discussed previous PT at [facility] and [facility] Person educated: Patient Education method:  Explanation, Demonstration, and Handouts Education comprehension: verbalized understanding and returned demonstration  HOME EXERCISE PROGRAM: Access Code: [email or web address] URL: [email or web address] Date: 01/27/2023 Prepared by: Dorene Grebe  Exercises -  Prone Press Up  - 2 x daily - 7 x weekly - 1 sets - 5 reps - 10 seconds hold - Supine Hamstring Stretch with Strap  - 2 x daily - 7 x weekly - 1 sets - 4 reps - 30 seconds hold - Supine Figure 4 Piriformis Stretch  - 2 x daily - 7 x weekly - 1 sets - 4 reps - 30 seconds hold - Supine Lower Trunk Rotation  - 2 x daily - 7 x weekly - 1 sets - 10 reps - Standing Lumbar Extension  - 2 x daily - 7 x weekly - 1 sets - 5 reps  ASSESSMENT:  CLINICAL IMPRESSION: Continuing PT POC with focus on manual techniques and progressing to core strengthening. Pt remains significantly limited in L hamstring tightness and mild pain with manual intervention/mobility work. Pt demonstrates excellent understanding of TrA activation exercises displaying ability to maintain core stability with UE/LE engagement without exacerbation of pain. Pt. Has 1 more PT tx. Session and will benefit from skilled PT services to increase lumbar ROM/ hip and core muscle strength to improve pain-free mobility.   OBJECTIVE IMPAIRMENTS: Abnormal gait, decreased activity tolerance, decreased mobility, difficulty walking, decreased ROM, decreased strength, hypomobility, impaired flexibility, improper body mechanics, postural dysfunction, and pain.   ACTIVITY LIMITATIONS: carrying, lifting, bending, sitting, standing, and locomotion level  PARTICIPATION LIMITATIONS: community activity, occupation, and yard work  PERSONAL FACTORS: Past/current experiences are also affecting patient's functional outcome.   REHAB POTENTIAL: Good  CLINICAL DECISION MAKING: Evolving/moderate complexity  EVALUATION COMPLEXITY: Moderate   GOALS: Goals reviewed with patient? Yes  SHORT TERM  GOALS: Target date: 02/17/23  Pt. Independent with HEP to increase core/ hip strength 1/2 muscle grade to improve pain-free mobility.   Baseline:  See above Goal status: Partially met   LONG TERM GOALS: Target date: 03/10/23  Pt. Will increase FOTO to 63 to improve pain-free mobility.   Baseline: initial 49.  6/11: 70 Goal status: Goal met  2.  Pt. Will reports no L LE/lateral thigh radicular symptoms with daily tasks consistently for a week to improve pain-free mobility.   Baseline: L lateral thigh radicular symptoms.  6/11: marked improvement.  Pt. Had 1 episode of L LE numbness after sitting for prolonged period.  Goal status: Partially met  3.  Pt. Will report <3/10 low back pain at worst with household/ work-related tasks.   Baseline: 6/10 pain at rest and >8/10 at worst.  Goal status: Partially met   PLAN:  PT FREQUENCY: 2x/week  PT DURATION: 6 weeks  PLANNED INTERVENTIONS: Therapeutic exercises, Therapeutic activity, Neuromuscular re-education, Balance training, Gait training, Patient/Family education, Self Care, Joint mobilization, Dry Needling, Electrical stimulation, Spinal mobilization, Cryotherapy, Moist heat, Manual therapy, and Re-evaluation.  PLAN FOR NEXT SESSION: Recert vs. Discharge.  Check goals and progress core based HEP  Cammie Mcgee, PT, DPT # [phone] Physical Therapist- [facility]  02/24/2023, 7:21 PM

## 2023-03-09 ENCOUNTER — Ambulatory Visit: Payer: 59 | Admitting: Physical Therapy

## 2023-03-09 DIAGNOSIS — M5417 Radiculopathy, lumbosacral region: Secondary | ICD-10-CM

## 2023-03-09 DIAGNOSIS — M2560 Stiffness of unspecified joint, not elsewhere classified: Secondary | ICD-10-CM

## 2023-03-09 DIAGNOSIS — M6281 Muscle weakness (generalized): Secondary | ICD-10-CM

## 2023-03-09 DIAGNOSIS — M5459 Other low back pain: Secondary | ICD-10-CM

## 2023-04-05 ENCOUNTER — Ambulatory Visit: Payer: 59 | Attending: Family Medicine | Admitting: Physical Therapy

## 2023-04-05 ENCOUNTER — Encounter: Payer: Self-pay | Admitting: Physical Therapy

## 2023-04-05 DIAGNOSIS — M2560 Stiffness of unspecified joint, not elsewhere classified: Secondary | ICD-10-CM | POA: Insufficient documentation

## 2023-04-05 DIAGNOSIS — M6281 Muscle weakness (generalized): Secondary | ICD-10-CM | POA: Insufficient documentation

## 2023-04-05 DIAGNOSIS — M5459 Other low back pain: Secondary | ICD-10-CM | POA: Insufficient documentation

## 2023-04-05 DIAGNOSIS — M5417 Radiculopathy, lumbosacral region: Secondary | ICD-10-CM | POA: Insufficient documentation

## 2023-04-05 NOTE — Therapy (Signed)
OUTPATIENT PHYSICAL THERAPY THORACOLUMBAR TREATMENT/ RECERTIFICATION  Patient Name: Tyler Adams MRN: [id number] DOB:11-28-1990, 32 y.o., male Today's Date: 04/05/2023  END OF SESSION:  PT End of Session - 04/05/23 0813     Visit Number 5    Number of Visits 13    Date for PT Re-Evaluation 05/03/23    PT Start Time 0813    PT Stop Time 0901    PT Time Calculation (min) 48 min    Activity Tolerance Patient tolerated treatment well    Behavior During Therapy [facility] for tasks assessed/performed             Past Surgical History:  Procedure Laterality Date   SHOULDER SURGERY     WISDOM TOOTH EXTRACTION     Patient Active Problem List   Diagnosis Date Noted   Lumbosacral radiculopathy 12/30/2022   PCP: Tyler Limbo, MD  REFERRING PROVIDER: Jerrol Banana, MD  REFERRING DIAG: Lumbosacral radiculopathy due to intervertebral disc disorder  Rationale for Evaluation and Treatment: Rehabilitation  THERAPY DIAG:  Lumbosacral radiculopathy  Other low back pain  Joint stiffness of spine  Muscle weakness (generalized)  ONSET DATE: >2 years (chronic)- started around birth of child  SUBJECTIVE:                                                                                                                                                                                           SUBJECTIVE STATEMENT: Pt. Discussed chronicity of low back symptoms over past 2 years. Pt. Went to PT at [facility] (03/2022) and then [facility] (07/2022).  Pt. States he "tweaked" back 6 weeks ago and fell on back yesterday after slipping on outside stairs.  Pt. Has a  standing desk at home for work.  Pt. Has used MH in past, not ice.  Pt. Reports 6/10 low back pain currently at rest, 3/10 pain at best.  Pt. Sits with moderate R lateral lean in chair.  Pt. States Prednisone has helped in past.    PERTINENT HISTORY:  Expand All Collapse All       Primary Care / Sports Medicine Office Visit   Patient  Information:  Patient ID: Tyler Adams, male DOB: [birth year] Age: 32 y.o. MRN: [id number]    Tyler Adams is a pleasant 32 y.o. male presenting with the following:       Chief Complaint  Patient presents with   Back Pain      Couple months, went to PT, woke up Thursday in severe pain unable to move.          Vitals:    12/30/22 1119  BP: 120/80  Pulse: 78  SpO2: 98%       Vitals:    12/30/22 1119  Weight: 253 lb (114.8 kg)  Height: 5\' 8"  (1.727 m)    Body mass index is 38.47 kg/m.   Imaging Results  No results found.      Independent interpretation of notes and tests performed by another provider:    Independent interpretation of 2 view lumbar spine from [facility] reveals maintained alignment, no intervertebral space loss, no facet hypertrophy, no listhesis, normal x-rays.   Procedures performed:    None   Pertinent History, Exam, Impression, and Recommendations:    Tyler Adams was seen today for back pain.   Lumbosacral radiculopathy Assessment & Plan: 2 year history of atraumatic low back pain, ties onset to birth of child and associated physical demands. Has been performing PT for months with initial improvement until last week when he began to note progressive worsening. Did see outside orthopedic group who ordered x-rays, started Medrol Pak, Flexeril, and he has noted mild improvement, still with left sided thigh and intermittent left foot paresthesias.   Examination with pain during lumbar flexion, +SLR left, +iliopsoas testing, L > R, paraspinal spasm, sensorimotor intact, provocative testing otherwise benign.   Given reassuring x-rays, clinical history and current findings despite treatments, plan as follows:   - Stop prior Medrol Dosepak - Start prednisone 50 mg daily x 3-5 days (stop when symptoms improve) - If symptoms persist past prednisone, dose meloxicam daily  - Start nightly gabapentin - Can continue cyclobenzaprine (muscle relaxer) as-needed -  Gentle activity as tolerated   Orders: -     MR LUMBAR SPINE WO CONTRAST; Future -     predniSONE; Take 1 tablet (50 mg total) by mouth daily.  Dispense: 5 tablet; Refill: 0 -     Gabapentin; Take 1 capsule (100 mg total) by mouth at bedtime.  Dispense: 21 capsule; Refill: 0 -     Meloxicam; Take 1 tablet (15 mg total) by mouth daily as needed for pain.  Dispense: 30 tablet; Refill: 0         Orders & Medications     Meds ordered this encounter  Medications   predniSONE (DELTASONE) 50 MG tablet      Sig: Take 1 tablet (50 mg total) by mouth daily.      Dispense:  5 tablet      Refill:  0   gabapentin (NEURONTIN) 100 MG capsule      Sig: Take 1 capsule (100 mg total) by mouth at bedtime.      Dispense:  21 capsule      Refill:  0   meloxicam (MOBIC) 15 MG tablet      Sig: Take 1 tablet (15 mg total) by mouth daily as needed for pain.      Dispense:  30 tablet      Refill:  0       Orders Placed This Encounter  Procedures   MR Lumbar Spine Wo Contrast      No follow-ups on file.      Tyler Banana, MD, CAQSM    Primary Care Sports Medicine Primary Care and Sports Medicine at [facility]         Assessment & Plan Note by Tyler Banana, MD at 12/30/2022 11:54 AM  Author: Jerrol Banana, MD Author Type: Physician Filed: 12/30/2022 11:54 AM  Note Status: Written Cosign: Cosign Not Required Encounter Date: 12/30/2022  Problem: Lumbosacral radiculopathy  Editor: Tyler Adams,  Tyler Bob, MD (Physician)             2 year history of atraumatic low back pain, ties onset to birth of child and associated physical demands. Has been performing PT for months with initial improvement until last week when he began to note progressive worsening. Did see outside orthopedic group who ordered x-rays, started Medrol Pak, Flexeril, and he has noted mild improvement, still with left sided thigh and intermittent left foot paresthesias.   Examination with pain during lumbar flexion,  +SLR left, +iliopsoas testing, L > R, paraspinal spasm, sensorimotor intact, provocative testing otherwise benign.   Given reassuring x-rays, clinical history and current findings despite treatments, plan as follows:   - Stop prior Medrol Dosepak - Start prednisone 50 mg daily x 3-5 days (stop when symptoms improve) - If symptoms persist past prednisone, dose meloxicam daily  - Start nightly gabapentin - Can continue cyclobenzaprine (muscle relaxer) as-needed - Gentle activity as tolerated       PAIN:  Are you having pain? Yes: NPRS scale: 6/10 Pain location: L low back Pain description: deep/radiating Aggravating factors: increase activity Relieving factors: meds/ rest  PRECAUTIONS: None  WEIGHT BEARING RESTRICTIONS: No  FALLS:  Has patient fallen in last 6 months? Yes. Number of falls 1 (yesterday on outside stairs/ landed directly on back)- no bruising noted.   LIVING ENVIRONMENT: Lives with: lives with their family Lives in: House/apartment Stairs: Yes: External: 3 steps; on right going up Has following equipment at home: None  OCCUPATION: Works remotely from home  PLOF: Independent  PATIENT GOALS: Decrease back pain/ complete everyday tasks with no pain  NEXT MD VISIT: PRN  OBJECTIVE:   DIAGNOSTIC FINDINGS:  EXAM: MRI LUMBAR SPINE WITHOUT CONTRAST   TECHNIQUE: Multiplanar, multisequence MR imaging of the lumbar spine was performed. No intravenous contrast was administered.   COMPARISON:  None Available.   FINDINGS: Segmentation: Standard. Lowest well-formed disc space labeled the L5-S1 level.   Alignment: Physiologic with preservation of the normal lumbar lordosis. No listhesis.   Vertebrae: Vertebral body height maintained without acute or chronic fracture. Bone marrow signal intensity within normal limits. No discrete or worrisome osseous lesions. No abnormal marrow edema.   Conus medullaris and cauda equina: Conus extends to the T12  level. Conus and cauda equina appear normal.   Paraspinal and other soft tissues: Unremarkable.   Disc levels:   L1-2:  Unremarkable.   L2-3:  Unremarkable.   L3-4: Disc desiccation with mild disc bulge. No spinal stenosis. Foramina remain patent.   L4-5: Disc desiccation. Superimposed central disc protrusion indents the ventral thecal sac (series 5, image 28). Protruding disc closely approximates the descending L5 nerve roots without frank impingement or displacement. Resultant mild narrowing of the lateral recesses bilaterally. Central canal remains patent. No significant foraminal stenosis.   L5-S1:  Unremarkable.   IMPRESSION: 1. Central disc protrusion at L4-5, closely approximating and potentially irritating either of the descending L5 nerve roots. No frank neural impingement. 2. Mild disc bulge at L3-4 without stenosis or impingement.     Electronically Signed   By: Rise Mu M.D.   On: 01/09/2023 00:17  PATIENT SURVEYS:  FOTO initial 49/ goal 63  SCREENING FOR RED FLAGS: Bowel or bladder incontinence: No Spinal tumors: No Cauda equina syndrome: No Compression fracture: No Abdominal aneurysm: No  COGNITION: Overall cognitive status: Within functional limits for tasks assessed     SENSATION: WFL  MUSCLE LENGTH: Hamstrings: Right 54 deg; Left 62 deg (  pain) Thomas test: NT  POSTURE: rounded shoulders.  Seated with R lateral lean  PALPATION: (+) L lumbar/ SI tenderness.  Prone low thoracic/lumbar spine hypomobility (generalized).  No reproduction of radicular symptoms.    LUMBAR ROM:   AROM eval  Flexion 50% limited  Extension 25% limited  Right lateral flexion WFL  Left lateral flexion WFL  Right rotation [facility] ("feels good")  Left rotation WFL    (Blank rows = not tested)  LOWER EXTREMITY ROM:     Active  Right eval Left eval  Hip flexion [facility]  Hip extension [facility]  Hip abduction [facility]  Hip adduction    Hip internal  rotation [facility] WFL  Hip external rotation [facility]  Knee flexion [facility] WFL  Knee extension [facility] WFL  Ankle dorsiflexion    Ankle plantarflexion    Ankle inversion    Ankle eversion     (Blank rows = not tested)  LOWER EXTREMITY MMT:    MMT Right eval Left eval  Hip flexion 4/5 4/5  Hip extension    Hip abduction 4/5 4/5  Hip adduction    Hip internal rotation    Hip external rotation    Knee flexion 5/5 5/5  Knee extension 5/5 5/5  Ankle dorsiflexion    Ankle plantarflexion    Ankle inversion    Ankle eversion     (Blank rows = not tested)  LUMBAR SPECIAL TESTS:  Straight leg raise test: Positive, SI Compression/distraction test: Negative, and FABER test: Negative  FUNCTIONAL TESTS:  5 times sit to stand: TBD  GAIT: Distance walked: in clinic Assistive device utilized: None Level of assistance: Complete Independence Comments: slight L antalgic gait with increase steps after standing.    TODAY'S TREATMENT:                                                                                                                              DATE: 04/05/2023  Subjective:  Pt reports 5/10 pain in low back prior to tx. Session.  Pt. Scheduled more PT appts. After completing some household chores to get house ready to sell.    Manual tx.:  Supine LE/lumbar stretches (generalized)- hamstring/ piriformis/ FABER: 3x30 sec/stretch  Prone grade II-III PA mobs. To low thoracic/lumbar 20 sec. X 2 (unilateral R and L/central)- focus on T8-L5) for pain modulation and joint mobility  There.ex:  No charge   Reviewed HEP   IFC to low back (x-pattern) with EMPI continuum device at 40% scan as tolerated output (11 mA)- sensory level  PATIENT EDUCATION:  Education details: Access Code: [email or web address] discussed previous PT at [facility] and [facility] Person educated: Patient Education method: Explanation, Demonstration, and Handouts Education comprehension: verbalized understanding and returned  demonstration  HOME EXERCISE PROGRAM: Access Code: [email or web address] URL: [email or web address] Date: 01/27/2023 Prepared by: Dorene Grebe  Exercises - Prone Press Up  - 2 x daily - 7 x weekly - 1 sets - 5 reps -  10 seconds hold - Supine Hamstring Stretch with Strap  - 2 x daily - 7 x weekly - 1 sets - 4 reps - 30 seconds hold - Supine Figure 4 Piriformis Stretch  - 2 x daily - 7 x weekly - 1 sets - 4 reps - 30 seconds hold - Supine Lower Trunk Rotation  - 2 x daily - 7 x weekly - 1 sets - 10 reps - Standing Lumbar Extension  - 2 x daily - 7 x weekly - 1 sets - 5 reps  ASSESSMENT:  CLINICAL IMPRESSION: 04/05/23  Continuing PT POC with focus on manual techniques and progressing to core strengthening. Pt remains significantly limited in L hamstring tightness and mild pain with manual intervention/mobility work. Pt demonstrates excellent understanding of TrA activation exercises displaying ability to maintain core stability with UE/LE engagement without exacerbation of pain.  See updated goals.   OBJECTIVE IMPAIRMENTS: Abnormal gait, decreased activity tolerance, decreased mobility, difficulty walking, decreased ROM, decreased strength, hypomobility, impaired flexibility, improper body mechanics, postural dysfunction, and pain.   ACTIVITY LIMITATIONS: carrying, lifting, bending, sitting, standing, and locomotion level  PARTICIPATION LIMITATIONS: community activity, occupation, and yard work  PERSONAL FACTORS: Past/current experiences are also affecting patient's functional outcome.   REHAB POTENTIAL: Good  CLINICAL DECISION MAKING: Evolving/moderate complexity  EVALUATION COMPLEXITY: Moderate   GOALS: Goals reviewed with patient? Yes  SHORT TERM GOALS: Target date: 04/19/23  Pt. Independent with HEP to increase core/ hip strength 1/2 muscle grade to improve pain-free mobility.   Baseline:  See above Goal status: Partially met   LONG TERM GOALS: Target date: 05/03/23  Pt.  Will increase FOTO to 63 to improve pain-free mobility.   Baseline: initial 49.  6/11: 70.  7/22: 56 Goal status: Partially met  2.  Pt. Will reports no L LE/lateral thigh radicular symptoms with daily tasks consistently for a week to improve pain-free mobility.   Baseline: L lateral thigh radicular symptoms.  6/11: marked improvement.  Pt. Had 1 episode of L LE numbness after sitting for prolonged period.  Goal status: Partially met  3.  Pt. Will report <3/10 low back pain at worst with household/ work-related tasks.   Baseline: 6/10 pain at rest and >8/10 at worst.  Goal status: Partially met   PLAN:  PT FREQUENCY: 2x/week  PT DURATION: 4 weeks  PLANNED INTERVENTIONS: Therapeutic exercises, Therapeutic activity, Neuromuscular re-education, Balance training, Gait training, Patient/Family education, Self Care, Joint mobilization, Dry Needling, Electrical stimulation, Spinal mobilization, Cryotherapy, Moist heat, Manual therapy, and Re-evaluation.  PLAN FOR NEXT SESSION: Progress HEP next tx.   Cammie Mcgee, PT, DPT # [id number] Physical Therapist - [facility]   04/05/2023, 2:13 PM

## 2023-04-07 ENCOUNTER — Encounter: Payer: 59 | Admitting: Physical Therapy

## 2023-04-12 ENCOUNTER — Ambulatory Visit: Payer: 59 | Admitting: Physical Therapy

## 2023-04-12 ENCOUNTER — Encounter: Payer: Self-pay | Admitting: Physical Therapy

## 2023-04-12 DIAGNOSIS — M2560 Stiffness of unspecified joint, not elsewhere classified: Secondary | ICD-10-CM

## 2023-04-12 DIAGNOSIS — M5417 Radiculopathy, lumbosacral region: Secondary | ICD-10-CM | POA: Diagnosis not present

## 2023-04-12 DIAGNOSIS — M6281 Muscle weakness (generalized): Secondary | ICD-10-CM

## 2023-04-12 DIAGNOSIS — M5459 Other low back pain: Secondary | ICD-10-CM

## 2023-04-12 NOTE — Therapy (Signed)
OUTPATIENT PHYSICAL THERAPY THORACOLUMBAR TREATMENT  Patient Name: Tyler Adams MRN: [id number] DOB:07-13-1991, 32 y.o., male Today's Date: 04/12/2023  END OF SESSION:  PT End of Session - 04/12/23 0817     Visit Number 6    Number of Visits 13    Date for PT Re-Evaluation 05/03/23    PT Start Time 0817    PT Stop Time 0907    PT Time Calculation (min) 50 min    Activity Tolerance Patient tolerated treatment well    Behavior During Therapy [facility] for tasks assessed/performed             Past Surgical History:  Procedure Laterality Date   SHOULDER SURGERY     WISDOM TOOTH EXTRACTION     Patient Active Problem List   Diagnosis Date Noted   Lumbosacral radiculopathy 12/30/2022   PCP: Bernerd Limbo, MD  REFERRING PROVIDER: Jerrol Banana, MD  REFERRING DIAG: Lumbosacral radiculopathy due to intervertebral disc disorder  Rationale for Evaluation and Treatment: Rehabilitation  THERAPY DIAG:  Lumbosacral radiculopathy  Other low back pain  Joint stiffness of spine  Muscle weakness (generalized)  ONSET DATE: >2 years (chronic)- started around birth of child  SUBJECTIVE:                                                                                                                                                                                           SUBJECTIVE STATEMENT: Pt. Discussed chronicity of low back symptoms over past 2 years. Pt. Went to PT at [facility] (03/2022) and then [facility] (07/2022).  Pt. States he "tweaked" back 6 weeks ago and fell on back yesterday after slipping on outside stairs.  Pt. Has a  standing desk at home for work.  Pt. Has used MH in past, not ice.  Pt. Reports 6/10 low back pain currently at rest, 3/10 pain at best.  Pt. Sits with moderate R lateral lean in chair.  Pt. States Prednisone has helped in past.    PERTINENT HISTORY:  Expand All Collapse All       Primary Care / Sports Medicine Office Visit   Patient Information:   Patient ID: Tyler Adams, male DOB: 11-03-1990 Age: 32 y.o. MRN: [id number]    Tyler Adams is a pleasant 32 y.o. male presenting with the following:       Chief Complaint  Patient presents with   Back Pain      Couple months, went to PT, woke up Thursday in severe pain unable to move.          Vitals:    12/30/22 1119  BP: 120/80  Pulse:  78  SpO2: 98%       Vitals:    12/30/22 1119  Weight: 253 lb (114.8 kg)  Height: 5\' 8"  (1.727 m)    Body mass index is 38.47 kg/m.   Imaging Results  No results found.      Independent interpretation of notes and tests performed by another provider:    Independent interpretation of 2 view lumbar spine from [facility] reveals maintained alignment, no intervertebral space loss, no facet hypertrophy, no listhesis, normal x-rays.   Procedures performed:    None   Pertinent History, Exam, Impression, and Recommendations:    [name] was seen today for back pain.   Lumbosacral radiculopathy Assessment & Plan: 2 year history of atraumatic low back pain, ties onset to birth of child and associated physical demands. Has been performing PT for months with initial improvement until last week when he began to note progressive worsening. Did see outside orthopedic group who ordered x-rays, started Medrol Pak, Flexeril, and he has noted mild improvement, still with left sided thigh and intermittent left foot paresthesias.   Examination with pain during lumbar flexion, +SLR left, +iliopsoas testing, L > R, paraspinal spasm, sensorimotor intact, provocative testing otherwise benign.   Given reassuring x-rays, clinical history and current findings despite treatments, plan as follows:   - Stop prior Medrol Dosepak - Start prednisone 50 mg daily x 3-5 days (stop when symptoms improve) - If symptoms persist past prednisone, dose meloxicam daily  - Start nightly gabapentin - Can continue cyclobenzaprine (muscle relaxer) as-needed - Gentle  activity as tolerated   Orders: -     MR LUMBAR SPINE WO CONTRAST; Future -     predniSONE; Take 1 tablet (50 mg total) by mouth daily.  Dispense: 5 tablet; Refill: 0 -     Gabapentin; Take 1 capsule (100 mg total) by mouth at bedtime.  Dispense: 21 capsule; Refill: 0 -     Meloxicam; Take 1 tablet (15 mg total) by mouth daily as needed for pain.  Dispense: 30 tablet; Refill: 0         Orders & Medications     Meds ordered this encounter  Medications   predniSONE (DELTASONE) 50 MG tablet      Sig: Take 1 tablet (50 mg total) by mouth daily.      Dispense:  5 tablet      Refill:  0   gabapentin (NEURONTIN) 100 MG capsule      Sig: Take 1 capsule (100 mg total) by mouth at bedtime.      Dispense:  21 capsule      Refill:  0   meloxicam (MOBIC) 15 MG tablet      Sig: Take 1 tablet (15 mg total) by mouth daily as needed for pain.      Dispense:  30 tablet      Refill:  0       Orders Placed This Encounter  Procedures   MR Lumbar Spine Wo Contrast      No follow-ups on file.      Jerrol Banana, MD, CAQSM    Primary Care Sports Medicine Primary Care and Sports Medicine at [facility]         Assessment & Plan Note by Jerrol Banana, MD at 12/30/2022 11:54 AM  Author: Jerrol Banana, MD Author Type: Physician Filed: 12/30/2022 11:54 AM  Note Status: Written Cosign: Cosign Not Required Encounter Date: 12/30/2022  Problem: Lumbosacral radiculopathy  Editor: Joseph Berkshire  J, MD (Physician)             2 year history of atraumatic low back pain, ties onset to birth of child and associated physical demands. Has been performing PT for months with initial improvement until last week when he began to note progressive worsening. Did see outside orthopedic group who ordered x-rays, started Medrol Pak, Flexeril, and he has noted mild improvement, still with left sided thigh and intermittent left foot paresthesias.   Examination with pain during lumbar flexion, +SLR  left, +iliopsoas testing, L > R, paraspinal spasm, sensorimotor intact, provocative testing otherwise benign.   Given reassuring x-rays, clinical history and current findings despite treatments, plan as follows:   - Stop prior Medrol Dosepak - Start prednisone 50 mg daily x 3-5 days (stop when symptoms improve) - If symptoms persist past prednisone, dose meloxicam daily  - Start nightly gabapentin - Can continue cyclobenzaprine (muscle relaxer) as-needed - Gentle activity as tolerated       PAIN:  Are you having pain? Yes: NPRS scale: 6/10 Pain location: L low back Pain description: deep/radiating Aggravating factors: increase activity Relieving factors: meds/ rest  PRECAUTIONS: None  WEIGHT BEARING RESTRICTIONS: No  FALLS:  Has patient fallen in last 6 months? Yes. Number of falls 1 (yesterday on outside stairs/ landed directly on back)- no bruising noted.   LIVING ENVIRONMENT: Lives with: lives with their family Lives in: House/apartment Stairs: Yes: External: 3 steps; on right going up Has following equipment at home: None  OCCUPATION: Works remotely from home  PLOF: Independent  PATIENT GOALS: Decrease back pain/ complete everyday tasks with no pain  NEXT MD VISIT: PRN  OBJECTIVE:   DIAGNOSTIC FINDINGS:  EXAM: MRI LUMBAR SPINE WITHOUT CONTRAST   TECHNIQUE: Multiplanar, multisequence MR imaging of the lumbar spine was performed. No intravenous contrast was administered.   COMPARISON:  None Available.   FINDINGS: Segmentation: Standard. Lowest well-formed disc space labeled the L5-S1 level.   Alignment: Physiologic with preservation of the normal lumbar lordosis. No listhesis.   Vertebrae: Vertebral body height maintained without acute or chronic fracture. Bone marrow signal intensity within normal limits. No discrete or worrisome osseous lesions. No abnormal marrow edema.   Conus medullaris and cauda equina: Conus extends to the T12 level. Conus  and cauda equina appear normal.   Paraspinal and other soft tissues: Unremarkable.   Disc levels:   L1-2:  Unremarkable.   L2-3:  Unremarkable.   L3-4: Disc desiccation with mild disc bulge. No spinal stenosis. Foramina remain patent.   L4-5: Disc desiccation. Superimposed central disc protrusion indents the ventral thecal sac (series 5, image 28). Protruding disc closely approximates the descending L5 nerve roots without frank impingement or displacement. Resultant mild narrowing of the lateral recesses bilaterally. Central canal remains patent. No significant foraminal stenosis.   L5-S1:  Unremarkable.   IMPRESSION: 1. Central disc protrusion at L4-5, closely approximating and potentially irritating either of the descending L5 nerve roots. No frank neural impingement. 2. Mild disc bulge at L3-4 without stenosis or impingement.     Electronically Signed   By: Rise Mu M.D.   On: 01/09/2023 00:17  PATIENT SURVEYS:  FOTO initial 49/ goal 63  SCREENING FOR RED FLAGS: Bowel or bladder incontinence: No Spinal tumors: No Cauda equina syndrome: No Compression fracture: No Abdominal aneurysm: No  COGNITION: Overall cognitive status: Within functional limits for tasks assessed     SENSATION: WFL  MUSCLE LENGTH: Hamstrings: Right 54 deg; Left 62 deg (pain)  Thomas test: NT  POSTURE: rounded shoulders.  Seated with R lateral lean  PALPATION: (+) L lumbar/ SI tenderness.  Prone low thoracic/lumbar spine hypomobility (generalized).  No reproduction of radicular symptoms.    LUMBAR ROM:   AROM eval  Flexion 50% limited  Extension 25% limited  Right lateral flexion WFL  Left lateral flexion WFL  Right rotation [facility] ("feels good")  Left rotation WFL    (Blank rows = not tested)  LOWER EXTREMITY ROM:     Active  Right eval Left eval  Hip flexion [facility]  Hip extension [facility]  Hip abduction [facility]  Hip adduction    Hip internal rotation [facility] WFL   Hip external rotation [facility]  Knee flexion [facility] WFL  Knee extension [facility] WFL  Ankle dorsiflexion    Ankle plantarflexion    Ankle inversion    Ankle eversion     (Blank rows = not tested)  LOWER EXTREMITY MMT:    MMT Right eval Left eval  Hip flexion 4/5 4/5  Hip extension    Hip abduction 4/5 4/5  Hip adduction    Hip internal rotation    Hip external rotation    Knee flexion 5/5 5/5  Knee extension 5/5 5/5  Ankle dorsiflexion    Ankle plantarflexion    Ankle inversion    Ankle eversion     (Blank rows = not tested)  LUMBAR SPECIAL TESTS:  Straight leg raise test: Positive, SI Compression/distraction test: Negative, and FABER test: Negative  FUNCTIONAL TESTS:  5 times sit to stand: TBD  GAIT: Distance walked: in clinic Assistive device utilized: None Level of assistance: Complete Independence Comments: slight L antalgic gait with increase steps after standing.    TODAY'S TREATMENT:                                                                                                                              DATE: 04/12/2023  Subjective:  Pt reports 4-5/10 pain in low back (L>R) prior to tx. Session.  Pt. Had a nice trip to [place], [place] last week for a few days.  Pts. Was sore/stiff in L low back after traveling/ returning home from trip.  Pt. Understands importance of core stability/ proper body mechanics.    Manual tx.:  Supine LE/lumbar stretches (generalized)- hamstring/ piriformis/ FABER: 3x30 sec/stretch.  L/R LE neural glides.  Prone grade II-III PA mobs. To low thoracic/lumbar 20 sec. X 2 (unilateral R and L/central)- focus on T8-L5) for pain modulation and joint mobility  Prone press-ups 5x with holds (use of MH to low back).     Reviewed HEP/ Supine TrA activation (good technique)- marching and bridging with no increase back symptoms.   NOT TODAY IFC to low back (x-pattern) with EMPI continuum device at 40% scan as tolerated output (11 mA)- sensory  level  PATIENT EDUCATION:  Education details: Access Code: [email or web address]/ discussed previous PT at [facility] and [facility]  educated: Patient Education method: Explanation, Demonstration, and Handouts Education comprehension: verbalized understanding and returned demonstration  HOME EXERCISE PROGRAM: Access Code: [email or web address] URL: [email or web address] Date: 01/27/2023 Prepared by: Dorene Grebe  Exercises - Prone Press Up  - 2 x daily - 7 x weekly - 1 sets - 5 reps - 10 seconds hold - Supine Hamstring Stretch with Strap  - 2 x daily - 7 x weekly - 1 sets - 4 reps - 30 seconds hold - Supine Figure 4 Piriformis Stretch  - 2 x daily - 7 x weekly - 1 sets - 4 reps - 30 seconds hold - Supine Lower Trunk Rotation  - 2 x daily - 7 x weekly - 1 sets - 10 reps - Standing Lumbar Extension  - 2 x daily - 7 x weekly - 1 sets - 5 reps  ASSESSMENT:  CLINICAL IMPRESSION: 04/05/23  Continuing PT POC with focus on manual techniques and progressing to core strengthening. Pt remains limited in L hamstring tightness and mild pain with manual intervention/mobility work. Pt demonstrates excellent understanding of TrA activation exercises displaying ability to maintain core stability with UE/LE engagement without exacerbation of pain.  See updated goals.   OBJECTIVE IMPAIRMENTS: Abnormal gait, decreased activity tolerance, decreased mobility, difficulty walking, decreased ROM, decreased strength, hypomobility, impaired flexibility, improper body mechanics, postural dysfunction, and pain.   ACTIVITY LIMITATIONS: carrying, lifting, bending, sitting, standing, and locomotion level  PARTICIPATION LIMITATIONS: community activity, occupation, and yard work  PERSONAL FACTORS: Past/current experiences are also affecting patient's functional outcome.   REHAB POTENTIAL: Good  CLINICAL DECISION MAKING: Evolving/moderate complexity  EVALUATION COMPLEXITY: Moderate   GOALS: Goals reviewed with patient?  Yes  SHORT TERM GOALS: Target date: 04/19/23  Pt. Independent with HEP to increase core/ hip strength 1/2 muscle grade to improve pain-free mobility.   Baseline:  See above Goal status: Partially met   LONG TERM GOALS: Target date: 05/03/23  Pt. Will increase FOTO to 63 to improve pain-free mobility.   Baseline: initial 49.  6/11: 70.  7/22: 56 Goal status: Partially met  2.  Pt. Will reports no L LE/lateral thigh radicular symptoms with daily tasks consistently for a week to improve pain-free mobility.   Baseline: L lateral thigh radicular symptoms.  6/11: marked improvement.  Pt. Had 1 episode of L LE numbness after sitting for prolonged period.  Goal status: Partially met  3.  Pt. Will report <3/10 low back pain at worst with household/ work-related tasks.   Baseline: 6/10 pain at rest and >8/10 at worst.  Goal status: Partially met   PLAN:  PT FREQUENCY: 2x/week  PT DURATION: 4 weeks  PLANNED INTERVENTIONS: Therapeutic exercises, Therapeutic activity, Neuromuscular re-education, Balance training, Gait training, Patient/Family education, Self Care, Joint mobilization, Dry Needling, Electrical stimulation, Spinal mobilization, Cryotherapy, Moist heat, Manual therapy, and Re-evaluation.  PLAN FOR NEXT SESSION: Progress HEP next tx./ CHECK SCHEDULE.  Cammie Mcgee, PT, DPT # [id number] Physical Therapist - [facility]   04/12/2023, 9:16 AM

## 2023-04-14 ENCOUNTER — Encounter: Payer: Self-pay | Admitting: Physical Therapy

## 2023-04-14 ENCOUNTER — Ambulatory Visit: Payer: 59 | Admitting: Physical Therapy

## 2023-04-14 DIAGNOSIS — M5459 Other low back pain: Secondary | ICD-10-CM

## 2023-04-14 DIAGNOSIS — M2560 Stiffness of unspecified joint, not elsewhere classified: Secondary | ICD-10-CM

## 2023-04-14 DIAGNOSIS — M5417 Radiculopathy, lumbosacral region: Secondary | ICD-10-CM

## 2023-04-14 DIAGNOSIS — M6281 Muscle weakness (generalized): Secondary | ICD-10-CM

## 2023-04-14 NOTE — Therapy (Signed)
OUTPATIENT PHYSICAL THERAPY THORACOLUMBAR TREATMENT  Patient Name: Tyler Adams MRN: [id number] DOB:1990/10/08, 32 y.o., male Today's Date: 04/14/2023  END OF SESSION:  PT End of Session - 04/14/23 0804     Visit Number 7    Number of Visits 13    Date for PT Re-Evaluation 05/03/23    PT Start Time 0815    PT Stop Time 0903    PT Time Calculation (min) 48 min    Activity Tolerance Patient tolerated treatment well    Behavior During Therapy [facility] for tasks assessed/performed             Past Surgical History:  Procedure Laterality Date   SHOULDER SURGERY     WISDOM TOOTH EXTRACTION     Patient Active Problem List   Diagnosis Date Noted   Lumbosacral radiculopathy 12/30/2022   PCP: Bernerd Limbo, MD  REFERRING PROVIDER: Jerrol Banana, MD  REFERRING DIAG: Lumbosacral radiculopathy due to intervertebral disc disorder  Rationale for Evaluation and Treatment: Rehabilitation  THERAPY DIAG:  Lumbosacral radiculopathy  Other low back pain  Joint stiffness of spine  Muscle weakness (generalized)  ONSET DATE: >2 years (chronic)- started around birth of child  SUBJECTIVE:                                                                                                                                                                                           SUBJECTIVE STATEMENT: Pt. Discussed chronicity of low back symptoms over past 2 years. Pt. Went to PT at [facility] (03/2022) and then [facility] (07/2022).  Pt. States he "tweaked" back 6 weeks ago and fell on back yesterday after slipping on outside stairs.  Pt. Has a  standing desk at home for work.  Pt. Has used MH in past, not ice.  Pt. Reports 6/10 low back pain currently at rest, 3/10 pain at best.  Pt. Sits with moderate R lateral lean in chair.  Pt. States Prednisone has helped in past.    PERTINENT HISTORY:  Expand All Collapse All       Primary Care / Sports Medicine Office Visit   Patient Information:   Patient ID: Tyler Adams, male DOB: 11/03/1990 Age: 32 y.o. MRN: [id number]    Tyler Adams is a pleasant 32 y.o. male presenting with the following:       Chief Complaint  Patient presents with   Back Pain      Couple months, went to PT, woke up Thursday in severe pain unable to move.          Vitals:    12/30/22 1119  BP: 120/80  Pulse:  78  SpO2: 98%       Vitals:    12/30/22 1119  Weight: 253 lb (114.8 kg)  Height: 5\' 8"  (1.727 m)    Body mass index is 38.47 kg/m.    Independent interpretation of notes and tests performed by another provider:    Independent interpretation of 2 view lumbar spine from [facility] reveals maintained alignment, no intervertebral space loss, no facet hypertrophy, no listhesis, normal x-rays.   Procedures performed:    None   Pertinent History, Exam, Impression, and Recommendations:    Hoyle was seen today for back pain.   Lumbosacral radiculopathy Assessment & Plan: 2 year history of atraumatic low back pain, ties onset to birth of child and associated physical demands. Has been performing PT for months with initial improvement until last week when he began to note progressive worsening. Did see outside orthopedic group who ordered x-rays, started Medrol Pak, Flexeril, and he has noted mild improvement, still with left sided thigh and intermittent left foot paresthesias.   Examination with pain during lumbar flexion, +SLR left, +iliopsoas testing, L > R, paraspinal spasm, sensorimotor intact, provocative testing otherwise benign.   Given reassuring x-rays, clinical history and current findings despite treatments, plan as follows:   - Stop prior Medrol Dosepak - Start prednisone 50 mg daily x 3-5 days (stop when symptoms improve) - If symptoms persist past prednisone, dose meloxicam daily  - Start nightly gabapentin - Can continue cyclobenzaprine (muscle relaxer) as-needed - Gentle activity as tolerated   Orders: -     MR  LUMBAR SPINE WO CONTRAST; Future -     predniSONE; Take 1 tablet (50 mg total) by mouth daily.  Dispense: 5 tablet; Refill: 0 -     Gabapentin; Take 1 capsule (100 mg total) by mouth at bedtime.  Dispense: 21 capsule; Refill: 0 -     Meloxicam; Take 1 tablet (15 mg total) by mouth daily as needed for pain.  Dispense: 30 tablet; Refill: 0         Orders & Medications     Meds ordered this encounter  Medications   predniSONE (DELTASONE) 50 MG tablet      Sig: Take 1 tablet (50 mg total) by mouth daily.      Dispense:  5 tablet      Refill:  0   gabapentin (NEURONTIN) 100 MG capsule      Sig: Take 1 capsule (100 mg total) by mouth at bedtime.      Dispense:  21 capsule      Refill:  0   meloxicam (MOBIC) 15 MG tablet      Sig: Take 1 tablet (15 mg total) by mouth daily as needed for pain.      Dispense:  30 tablet      Refill:  0       Orders Placed This Encounter  Procedures   MR Lumbar Spine Wo Contrast      No follow-ups on file.      Jerrol Banana, MD, CAQSM    Primary Care Sports Medicine Primary Care and Sports Medicine at [facility]         Assessment & Plan Note by Jerrol Banana, MD at 12/30/2022 11:54 AM  Author: Jerrol Banana, MD Author Type: Physician Filed: 12/30/2022 11:54 AM  Note Status: Written Cosign: Cosign Not Required Encounter Date: 12/30/2022  Problem: Lumbosacral radiculopathy  Editor: Jerrol Banana, MD (Physician)  2 year history of atraumatic low back pain, ties onset to birth of child and associated physical demands. Has been performing PT for months with initial improvement until last week when he began to note progressive worsening. Did see outside orthopedic group who ordered x-rays, started Medrol Pak, Flexeril, and he has noted mild improvement, still with left sided thigh and intermittent left foot paresthesias.   Examination with pain during lumbar flexion, +SLR left, +iliopsoas testing, L > R, paraspinal  spasm, sensorimotor intact, provocative testing otherwise benign.   Given reassuring x-rays, clinical history and current findings despite treatments, plan as follows:   - Stop prior Medrol Dosepak - Start prednisone 50 mg daily x 3-5 days (stop when symptoms improve) - If symptoms persist past prednisone, dose meloxicam daily  - Start nightly gabapentin - Can continue cyclobenzaprine (muscle relaxer) as-needed - Gentle activity as tolerated       PAIN:  Are you having pain? Yes: NPRS scale: 6/10 Pain location: L low back Pain description: deep/radiating Aggravating factors: increase activity Relieving factors: meds/ rest  PRECAUTIONS: None  WEIGHT BEARING RESTRICTIONS: No  FALLS:  Has patient fallen in last 6 months? Yes. Number of falls 1 (yesterday on outside stairs/ landed directly on back)- no bruising noted.   LIVING ENVIRONMENT: Lives with: lives with their family Lives in: House/apartment Stairs: Yes: External: 3 steps; on right going up Has following equipment at home: None  OCCUPATION: Works remotely from home  PLOF: Independent  PATIENT GOALS: Decrease back pain/ complete everyday tasks with no pain  NEXT MD VISIT: PRN  OBJECTIVE:   DIAGNOSTIC FINDINGS:  EXAM: MRI LUMBAR SPINE WITHOUT CONTRAST   TECHNIQUE: Multiplanar, multisequence MR imaging of the lumbar spine was performed. No intravenous contrast was administered.   COMPARISON:  None Available.   FINDINGS: Segmentation: Standard. Lowest well-formed disc space labeled the L5-S1 level.   Alignment: Physiologic with preservation of the normal lumbar lordosis. No listhesis.   Vertebrae: Vertebral body height maintained without acute or chronic fracture. Bone marrow signal intensity within normal limits. No discrete or worrisome osseous lesions. No abnormal marrow edema.   Conus medullaris and cauda equina: Conus extends to the T12 level. Conus and cauda equina appear normal.   Paraspinal  and other soft tissues: Unremarkable.   Disc levels:   L1-2:  Unremarkable.   L2-3:  Unremarkable.   L3-4: Disc desiccation with mild disc bulge. No spinal stenosis. Foramina remain patent.   L4-5: Disc desiccation. Superimposed central disc protrusion indents the ventral thecal sac (series 5, image 28). Protruding disc closely approximates the descending L5 nerve roots without frank impingement or displacement. Resultant mild narrowing of the lateral recesses bilaterally. Central canal remains patent. No significant foraminal stenosis.   L5-S1:  Unremarkable.   IMPRESSION: 1. Central disc protrusion at L4-5, closely approximating and potentially irritating either of the descending L5 nerve roots. No frank neural impingement. 2. Mild disc bulge at L3-4 without stenosis or impingement.     Electronically Signed   By: Rise Mu M.D.   On: 01/09/2023 00:17  PATIENT SURVEYS:  FOTO initial 49/ goal 63  SCREENING FOR RED FLAGS: Bowel or bladder incontinence: No Spinal tumors: No Cauda equina syndrome: No Compression fracture: No Abdominal aneurysm: No  COGNITION: Overall cognitive status: Within functional limits for tasks assessed     SENSATION: WFL  MUSCLE LENGTH: Hamstrings: Right 54 deg; Left 62 deg (pain) Thomas test: NT  POSTURE: rounded shoulders.  Seated with R lateral lean  PALPATION: (+)  L lumbar/ SI tenderness.  Prone low thoracic/lumbar spine hypomobility (generalized).  No reproduction of radicular symptoms.    LUMBAR ROM:   AROM eval  Flexion 50% limited  Extension 25% limited  Right lateral flexion WFL  Left lateral flexion WFL  Right rotation [facility] ("feels good")  Left rotation WFL    (Blank rows = not tested)  LOWER EXTREMITY ROM:     Active  Right eval Left eval  Hip flexion [facility]  Hip extension [facility]  Hip abduction [facility]  Hip adduction    Hip internal rotation [facility] WFL  Hip external rotation [facility]  Knee flexion  [facility] WFL  Knee extension [facility] WFL  Ankle dorsiflexion    Ankle plantarflexion    Ankle inversion    Ankle eversion     (Blank rows = not tested)  LOWER EXTREMITY MMT:    MMT Right eval Left eval  Hip flexion 4/5 4/5  Hip extension    Hip abduction 4/5 4/5  Hip adduction    Hip internal rotation    Hip external rotation    Knee flexion 5/5 5/5  Knee extension 5/5 5/5  Ankle dorsiflexion    Ankle plantarflexion    Ankle inversion    Ankle eversion     (Blank rows = not tested)  LUMBAR SPECIAL TESTS:  Straight leg raise test: Positive, SI Compression/distraction test: Negative, and FABER test: Negative  FUNCTIONAL TESTS:  5 times sit to stand: TBD  GAIT: Distance walked: in clinic Assistive device utilized: None Level of assistance: Complete Independence Comments: slight L antalgic gait with increase steps after standing.    TODAY'S TREATMENT:                                                                                                                              DATE: 04/14/2023  Subjective:  Pt reports 3-4/10 pain in low back (L>R) prior to tx. Session.  No new complaints since last PT tx. Session.    Manual tx.:  Supine LE/lumbar stretches (generalized)- hamstring/ piriformis/ FABER: 3x30 sec/stretch.  L/R LE neural glides.  Prone grade II-III PA mobs. To low thoracic/lumbar 20 sec. x 2 (unilateral R and L/central)- focus on T8-L5) for pain modulation and joint mobility  There.ex.:  See updated HEP    NOT TODAY IFC to low back (x-pattern) with EMPI continuum device at 40% scan as tolerated output (11 mA)- sensory level  PATIENT EDUCATION:  Education details: Access Code: [email or web address] discussed previous PT at [facility] and [employer] Person educated: Patient Education method: Explanation, Demonstration, and Handouts Education comprehension: verbalized understanding and returned demonstration  HOME EXERCISE PROGRAM: Access Code: [email or web address] URL:  [email or web address] Date: 01/27/2023 Prepared by: Dorene Grebe  Exercises - Prone Press Up  - 2 x daily - 7 x weekly - 1 sets - 5 reps - 10 seconds hold - Supine Hamstring Stretch with Strap  - 2 x daily - 7 x weekly -  1 sets - 4 reps - 30 seconds hold - Supine Figure 4 Piriformis Stretch  - 2 x daily - 7 x weekly - 1 sets - 4 reps - 30 seconds hold - Supine Lower Trunk Rotation  - 2 x daily - 7 x weekly - 1 sets - 10 reps - Standing Lumbar Extension  - 2 x daily - 7 x weekly - 1 sets - 5 reps  Access Code: [email or web address] URL: [email or web address] Date: 04/14/2023 Prepared by: Dorene Grebe  Exercises - Cat Cow  - 1 x daily - 5 x weekly - 1 sets - 10 reps - Bird Dog  - 1 x daily - 5 x weekly - 1 sets - 20 reps - [place]  - 1 x daily - 5 x weekly - 1 sets - 20 reps - Standing Anti-Rotation Press with Anchored Resistance  - 1 x daily - 5 x weekly - 1 sets - 20 reps - Squat with Chest Press  - 1 x daily - 5 x weekly - 1 sets - 20 reps  ASSESSMENT:  CLINICAL IMPRESSION: 04/05/23  Continuing PT POC with focus on manual techniques and progressing to core strengthening.  Pt demonstrates excellent understanding of TrA activation exercises displaying ability to maintain core stability with UE/LE engagement without exacerbation of pain.  Pt. Has 1 more PT tx. Session scheduled and will reassess goals/ PT progression.     OBJECTIVE IMPAIRMENTS: Abnormal gait, decreased activity tolerance, decreased mobility, difficulty walking, decreased ROM, decreased strength, hypomobility, impaired flexibility, improper body mechanics, postural dysfunction, and pain.   ACTIVITY LIMITATIONS: carrying, lifting, bending, sitting, standing, and locomotion level  PARTICIPATION LIMITATIONS: community activity, occupation, and yard work  PERSONAL FACTORS: Past/current experiences are also affecting patient's functional outcome.   REHAB POTENTIAL: Good  CLINICAL DECISION MAKING:  Evolving/moderate complexity  EVALUATION COMPLEXITY: Moderate   GOALS: Goals reviewed with patient? Yes  SHORT TERM GOALS: Target date: 04/19/23  Pt. Independent with HEP to increase core/ hip strength 1/2 muscle grade to improve pain-free mobility.   Baseline:  See above Goal status: Partially met   LONG TERM GOALS: Target date: 05/03/23  Pt. Will increase FOTO to 63 to improve pain-free mobility.   Baseline: initial 49.  6/11: 70.  7/22: 56 Goal status: Partially met  2.  Pt. Will reports no L LE/lateral thigh radicular symptoms with daily tasks consistently for a week to improve pain-free mobility.   Baseline: L lateral thigh radicular symptoms.  6/11: marked improvement.  Pt. Had 1 episode of L LE numbness after sitting for prolonged period.  Goal status: Partially met  3.  Pt. Will report <3/10 low back pain at worst with household/ work-related tasks.   Baseline: 6/10 pain at rest and >8/10 at worst.  Goal status: Partially met   PLAN:  PT FREQUENCY: 2x/week  PT DURATION: 4 weeks  PLANNED INTERVENTIONS: Therapeutic exercises, Therapeutic activity, Neuromuscular re-education, Balance training, Gait training, Patient/Family education, Self Care, Joint mobilization, Dry Needling, Electrical stimulation, Spinal mobilization, Cryotherapy, Moist heat, Manual therapy, and Re-evaluation.  PLAN FOR NEXT SESSION: CHECK SCHEDULE.  Cammie Mcgee, PT, DPT # [phone] Physical Therapist - [facility]   04/14/2023, 10:12 AM

## 2023-04-19 ENCOUNTER — Ambulatory Visit: Payer: 59 | Attending: Family Medicine | Admitting: Physical Therapy
